# Patient Record
Sex: Female | Born: 1975 | Race: Black or African American | Hispanic: No | Smoking: Never smoker
Health system: Southern US, Community
[De-identification: ages and names within clinical notes are randomized; demographics above are authoritative.]

## PROBLEM LIST (undated history)

## (undated) DIAGNOSIS — G809 Cerebral palsy, unspecified: Secondary | ICD-10-CM

## (undated) DIAGNOSIS — M1990 Unspecified osteoarthritis, unspecified site: Secondary | ICD-10-CM

## (undated) HISTORY — PX: TRACHEOSTOMY: SUR1362

## (undated) HISTORY — PX: ANKLE FUSION: SHX881

## (undated) HISTORY — PX: LEG SURGERY: SHX1003

---

## 2011-05-28 ENCOUNTER — Emergency Department (HOSPITAL_COMMUNITY): Payer: Medicare Other

## 2011-05-28 ENCOUNTER — Emergency Department (HOSPITAL_COMMUNITY)
Admission: EM | Admit: 2011-05-28 | Discharge: 2011-05-28 | Disposition: A | Discharge status: Home / Self Care | Payer: Medicare Other | Attending: Emergency Medicine | Admitting: Emergency Medicine

## 2011-05-28 DIAGNOSIS — R05 Cough: Secondary | ICD-10-CM | POA: Insufficient documentation

## 2011-05-28 DIAGNOSIS — G809 Cerebral palsy, unspecified: Secondary | ICD-10-CM | POA: Insufficient documentation

## 2011-05-28 DIAGNOSIS — M129 Arthropathy, unspecified: Secondary | ICD-10-CM | POA: Insufficient documentation

## 2011-05-28 DIAGNOSIS — J3489 Other specified disorders of nose and nasal sinuses: Secondary | ICD-10-CM | POA: Insufficient documentation

## 2011-05-28 DIAGNOSIS — R059 Cough, unspecified: Secondary | ICD-10-CM | POA: Insufficient documentation

## 2011-05-28 DIAGNOSIS — R319 Hematuria, unspecified: Secondary | ICD-10-CM | POA: Insufficient documentation

## 2011-09-01 ENCOUNTER — Inpatient Hospital Stay (INDEPENDENT_AMBULATORY_CARE_PROVIDER_SITE_OTHER)
Admission: RE | Admit: 2011-09-01 | Discharge: 2011-09-01 | Disposition: A | Discharge status: Home / Self Care | Payer: Medicare Other | Source: Ambulatory Visit | Attending: Family Medicine | Admitting: Family Medicine

## 2011-09-01 DIAGNOSIS — S40029A Contusion of unspecified upper arm, initial encounter: Secondary | ICD-10-CM

## 2011-09-01 DIAGNOSIS — M542 Cervicalgia: Secondary | ICD-10-CM

## 2011-10-28 ENCOUNTER — Inpatient Hospital Stay (INDEPENDENT_AMBULATORY_CARE_PROVIDER_SITE_OTHER)
Admission: RE | Admit: 2011-10-28 | Discharge: 2011-10-28 | Disposition: A | Discharge status: Home / Self Care | Payer: Medicare Other | Source: Ambulatory Visit | Attending: Emergency Medicine | Admitting: Emergency Medicine

## 2011-10-28 DIAGNOSIS — N390 Urinary tract infection, site not specified: Secondary | ICD-10-CM

## 2011-10-28 LAB — POCT PREGNANCY, URINE: Preg Test, Ur: NEGATIVE

## 2011-10-28 LAB — POCT URINALYSIS DIP (DEVICE): Ketones, ur: NEGATIVE mg/dL

## 2012-02-04 ENCOUNTER — Encounter (HOSPITAL_COMMUNITY): Payer: Self-pay | Admitting: *Deleted

## 2012-02-04 ENCOUNTER — Emergency Department (INDEPENDENT_AMBULATORY_CARE_PROVIDER_SITE_OTHER)
Admission: EM | Admit: 2012-02-04 | Discharge: 2012-02-04 | Disposition: A | Discharge status: Home / Self Care | Payer: Medicare Other | Source: Home / Self Care

## 2012-02-04 DIAGNOSIS — K529 Noninfective gastroenteritis and colitis, unspecified: Secondary | ICD-10-CM

## 2012-02-04 DIAGNOSIS — K5289 Other specified noninfective gastroenteritis and colitis: Secondary | ICD-10-CM

## 2012-02-04 HISTORY — DX: Cerebral palsy, unspecified: G80.9

## 2012-02-04 MED ORDER — ONDANSETRON HCL 8 MG PO TABS: 8.0000 mg | ORAL_TABLET | Freq: Three times a day (TID) | ORAL | Status: AC | PRN

## 2012-02-04 MED ORDER — ONDANSETRON 4 MG PO TBDP
ORAL_TABLET | ORAL | Status: AC
  Filled 2012-02-04: qty 2

## 2012-02-04 MED ORDER — ONDANSETRON 4 MG PO TBDP
8.0000 mg | ORAL_TABLET | Freq: Once | ORAL | Status: AC
  Administered 2012-02-04: 8 mg via ORAL

## 2012-02-04 NOTE — ED Notes (Signed)
Pt  Has  Symptoms of  Vomiting  /  Diarrhea  With  Some  dizzyness         Started  This  Am  -   No  Pain     Ambulated  Well to  Room  Pt  Has  CP         She  Is  Awake  Alert  Pleasant      No  Active  vomiting

## 2012-02-04 NOTE — ED Provider Notes (Signed)
History     CSN: [id number]  Arrival date & time 02/04/12  1430   None     Chief Complaint  Patient presents with  . Emesis    (Consider location/radiation/quality/duration/timing/severity/associated sxs/prior treatment) HPI Comments: Patient presents with c/o onset of vomiting and diarrhea this morning. She has had 2 loose stools today, no blood. She has vomited approximately 20 times. She states she is hungry but afraid to try to eat anything. She is drinking small amounts of ginger ale. She is voiding without dysuria. She has epigastric abdominal pain only when vomiting. LMP was normal. No known sick contacts.    Past Medical History  Diagnosis Date  . CP (cerebral palsy)     History reviewed. No pertinent past surgical history.  History reviewed. No pertinent family history.  History  Substance Use Topics  . Smoking status: Not on file  . Smokeless tobacco: Not on file  . Alcohol Use:     OB History    Grav Para Term Preterm Abortions TAB SAB Ect Mult Living                  Review of Systems  Constitutional: Positive for fatigue. Negative for fever and chills.  Respiratory: Negative for cough and shortness of breath.   Cardiovascular: Negative for chest pain.  Gastrointestinal: Positive for nausea, vomiting and abdominal pain. Negative for diarrhea and constipation.  Genitourinary: Negative for dysuria and frequency.  Neurological: Negative for dizziness and headaches.    Allergies  Review of patient's allergies indicates no known allergies.  Home Medications   Current Outpatient Rx  Name Route Sig Dispense Refill  . IBUPROFEN 800 MG PO TABS Oral Take 800 mg by mouth every 8 (eight) hours as needed.    Marland Kitchen TRAMADOL HCL 50 MG PO TABS Oral Take 50 mg by mouth every 6 (six) hours as needed.    Marland Kitchen ONDANSETRON HCL 8 MG PO TABS Oral Take 1 tablet (8 mg total) by mouth every 8 (eight) hours as needed for nausea. 12 tablet 0    BP 109/71  Pulse 91  Temp(Src)  97.1 F (36.2 C) (Oral)  Resp 16  SpO2 97%  LMP 01/15/2012  Physical Exam  Nursing note and vitals reviewed. Constitutional: She appears well-developed and well-nourished. No distress.  HENT:  Head: Normocephalic and atraumatic.  Right Ear: Tympanic membrane, external ear and ear canal normal.  Left Ear: Tympanic membrane, external ear and ear canal normal.  Nose: Nose normal.  Mouth/Throat: Uvula is midline, oropharynx is clear and moist and mucous membranes are normal. No oropharyngeal exudate, posterior oropharyngeal edema or posterior oropharyngeal erythema.  Neck: Neck supple.  Cardiovascular: Normal rate, regular rhythm and normal heart sounds.   Pulmonary/Chest: Effort normal and breath sounds normal. No respiratory distress.  Abdominal: Soft. Normal appearance and bowel sounds are normal. She exhibits no mass. There is no hepatosplenomegaly. There is tenderness (mild) in the epigastric area. There is no rebound, no guarding and no CVA tenderness.  Lymphadenopathy:    She has no cervical adenopathy.  Neurological: She is alert.  Skin: Skin is warm and dry.  Psychiatric: She has a normal mood and affect.    ED Course  Procedures (including critical care time)  Labs Reviewed - No data to display No results found.   1. Gastroenteritis       MDM  Onset of V/D this am. Pt drinking ginger ale in the exam room. No s/s of dehydration.  Melody Comas, [place] 02/04/12 [phone]

## 2012-02-07 NOTE — ED Provider Notes (Signed)
Medical screening examination/treatment/procedure(s) were performed by non-physician practitioner and as supervising physician I was immediately available for consultation/collaboration.  Luiz Blare MD   Luiz Blare, MD 02/07/12 [phone]

## 2012-02-19 ENCOUNTER — Encounter (HOSPITAL_COMMUNITY): Payer: Self-pay | Admitting: Emergency Medicine

## 2012-02-19 ENCOUNTER — Emergency Department (INDEPENDENT_AMBULATORY_CARE_PROVIDER_SITE_OTHER)
Admission: EM | Admit: 2012-02-19 | Discharge: 2012-02-19 | Disposition: A | Discharge status: Home / Self Care | Payer: Medicaid Other | Source: Home / Self Care | Attending: Emergency Medicine | Admitting: Emergency Medicine

## 2012-02-19 DIAGNOSIS — S335XXA Sprain of ligaments of lumbar spine, initial encounter: Secondary | ICD-10-CM

## 2012-02-19 DIAGNOSIS — S39012A Strain of muscle, fascia and tendon of lower back, initial encounter: Secondary | ICD-10-CM

## 2012-02-19 MED ORDER — IBUPROFEN 800 MG PO TABS: 800.0000 mg | ORAL_TABLET | Freq: Three times a day (TID) | ORAL | Status: AC

## 2012-02-19 MED ORDER — METHOCARBAMOL 500 MG PO TABS: 500.0000 mg | ORAL_TABLET | Freq: Three times a day (TID) | ORAL | Status: AC

## 2012-02-19 MED ORDER — TRAMADOL HCL 50 MG PO TABS: 100.0000 mg | ORAL_TABLET | Freq: Three times a day (TID) | ORAL | Status: AC | PRN

## 2012-02-19 NOTE — Discharge Instructions (Signed)
Back Pain, Adult Low back pain is very common. About 1 in 5 people have back pain.The cause of low back pain is rarely dangerous. The pain often gets better over time.About half of people with a sudden onset of back pain feel better in just 2 weeks. About 8 in 10 people feel better by 6 weeks.  CAUSES Some common causes of back pain include:  Strain of the muscles or ligaments supporting the spine.   Wear and tear (degeneration) of the spinal discs.   Arthritis.   Direct injury to the back.  DIAGNOSIS Most of the time, the direct cause of low back pain is not known.However, back pain can be treated effectively even when the exact cause of the pain is unknown.Answering your caregiver's questions about your overall health and symptoms is one of the most accurate ways to make sure the cause of your pain is not dangerous. If your caregiver needs more information, he or she may order lab work or imaging tests (X-rays or MRIs).However, even if imaging tests show changes in your back, this usually does not require surgery. HOME CARE INSTRUCTIONS For many people, back pain returns.Since low back pain is rarely dangerous, it is often a condition that people can learn to manageon their own.   Remain active. It is stressful on the back to sit or stand in one place. Do not sit, drive, or stand in one place for more than 30 minutes at a time. Take short walks on level surfaces as soon as pain allows.Try to increase the length of time you walk each day.   Do not stay in bed.Resting more than 1 or 2 days can delay your recovery.   Do not avoid exercise or work.Your body is made to move.It is not dangerous to be active, even though your back may hurt.Your back will likely heal faster if you return to being active before your pain is gone.   Pay attention to your body when you bend and lift. Many people have less discomfortwhen lifting if they bend their knees, keep the load close to their  bodies,and avoid twisting. Often, the most comfortable positions are those that put less stress on your recovering back.   Find a comfortable position to sleep. Use a firm mattress and lie on your side with your knees slightly bent. If you lie on your back, put a pillow under your knees.   Only take over-the-counter or prescription medicines as directed by your caregiver. Over-the-counter medicines to reduce pain and inflammation are often the most helpful.Your caregiver may prescribe muscle relaxant drugs.These medicines help dull your pain so you can more quickly return to your normal activities and healthy exercise.   Put ice on the injured area.   Put ice in a plastic bag.   Place a towel between your skin and the bag.   Leave the ice on for 15 to 20 minutes, 3 to 4 times a day for the first 2 to 3 days. After that, ice and heat may be alternated to reduce pain and spasms.   Ask your caregiver about trying back exercises and gentle massage. This may be of some benefit.   Avoid feeling anxious or stressed.Stress increases muscle tension and can worsen back pain.It is important to recognize when you are anxious or stressed and learn ways to manage it.Exercise is a great option.  SEEK MEDICAL CARE IF:  You have pain that is not relieved with rest or medicine.   You have   pain that does not improve in 1 week.   You have new symptoms.   You are generally not feeling well.  SEEK IMMEDIATE MEDICAL CARE IF:   You have pain that radiates from your back into your legs.   You develop new bowel or bladder control problems.   You have unusual weakness or numbness in your arms or legs.   You develop nausea or vomiting.   You develop abdominal pain.   You feel faint.  Document Released: 12/13/2005 Document Revised: 08/25/2011 Document Reviewed: 05/03/2011 [employer] Patient Information 2012 [employer]. 

## 2012-02-19 NOTE — ED Notes (Signed)
Back pain for a week, worsened today.  No known injury.  Patient reports history of back pain.

## 2012-02-19 NOTE — ED Notes (Signed)
Denies numbness or tingling in legs

## 2012-02-19 NOTE — ED Provider Notes (Signed)
Chief Complaint  Patient presents with  . Back Pain    History of Present Illness:   Mrs. Palla is an unfortunate 36 year old female with cerebral palsy. Because of the way she walks she's had lower back pain almost all her life. Usually she controls this with ibuprofen, but recently she and her daughter moved into a new apartment and they haven't had any beds or mattresses to sleep on, so they've been sleeping on the floor. Because of this her back pain has been worse. She describes pain in her lower lumbar spine without radiation. No numbness, tingling, muscle weakness, bladder, or bowel problems.  Review of Systems:  Other than noted above, the patient denies any of the following symptoms: Systemic:  No fever, chills, fatigue, or weight loss. GI:  No abdominal pain, nausea, vomiting, diarrhea, constipation or blood in stool. GU:  No dysuria, frequency, urgency, or hematuria. No incontinence or difficulty urinating.  M-S:  No neck pain, joint pain, arthritis, or myalgias. Neuro:  No parethesias or muscular weakness. Skin:  No rash or itching.   PMFSH:  Past medical history, family history, social history, meds, and allergies were reviewed.  Physical Exam:   Vital signs:  BP 119/73  Pulse 90  Temp(Src) 98.3 F (36.8 C) (Oral)  Resp 20  SpO2 100%  LMP 02/15/2012 General:  Alert, oriented, in no distress. Abdomen:  Soft, non-tender.  No organomegaly or mass.  No pulsatile midline abdominal mass or bruit. Back:  There was pain to palpation in the paravertebral muscles of the mid to lower lumbar spine bilaterally. There is no midline pain to palpation and no pain to palpation of the sacroiliac joints. Her back has limited range of motion, about 30% of normal, with pain. Straight leg raising produces some muscle stretching and pain but no radiating type pain Neuro:  Normal muscle strength, sensations and DTRs. Skin:  Clear, warm and dry.  No rash.   Radiology:  No results  found.  Assessment:   Diagnoses that have been ruled out:  None  Diagnoses that are still under consideration:  None  Final diagnoses:  Lumbar strain    Plan:   1.  The following meds were prescribed:   New Prescriptions   IBUPROFEN (ADVIL,MOTRIN) 800 MG TABLET    Take 1 tablet (800 mg total) by mouth 3 (three) times daily.   METHOCARBAMOL (ROBAXIN) 500 MG TABLET    Take 1 tablet (500 mg total) by mouth 3 (three) times daily.   TRAMADOL (ULTRAM) 50 MG TABLET    Take 2 tablets (100 mg total) by mouth every 8 (eight) hours as needed for pain.   2.  The patient was instructed in symptomatic care and handouts were given. 3.  The patient was told to return if becoming worse in any way, if no better in 3 or 4 days, and given some red flag symptoms that would indicate earlier return.    Roque Lias, MD 02/19/12 [phone]

## 2012-02-19 NOTE — ED Notes (Signed)
Patient made aware of delays and had no needs at this time 

## 2012-02-21 MED ORDER — TIZANIDINE HCL 4 MG PO TABS: 4.0000 mg | ORAL_TABLET | Freq: Four times a day (QID) | ORAL | Status: AC | PRN

## 2012-05-06 ENCOUNTER — Emergency Department (INDEPENDENT_AMBULATORY_CARE_PROVIDER_SITE_OTHER)
Admission: EM | Admit: 2012-05-06 | Discharge: 2012-05-06 | Disposition: A | Discharge status: Home / Self Care | Payer: Medicare Other | Source: Home / Self Care | Attending: Emergency Medicine | Admitting: Emergency Medicine

## 2012-05-06 ENCOUNTER — Encounter (HOSPITAL_COMMUNITY): Payer: Self-pay

## 2012-05-06 DIAGNOSIS — N390 Urinary tract infection, site not specified: Secondary | ICD-10-CM

## 2012-05-06 DIAGNOSIS — B356 Tinea cruris: Secondary | ICD-10-CM

## 2012-05-06 LAB — POCT URINALYSIS DIP (DEVICE)
Bilirubin Urine: NEGATIVE
Ketones, ur: NEGATIVE mg/dL

## 2012-05-06 LAB — POCT PREGNANCY, URINE: Preg Test, Ur: NEGATIVE

## 2012-05-06 MED ORDER — CEPHALEXIN 500 MG PO CAPS: 500.0000 mg | ORAL_CAPSULE | Freq: Three times a day (TID) | ORAL | Status: AC

## 2012-05-06 MED ORDER — TERBINAFINE HCL 1 % EX CREA: TOPICAL_CREAM | Freq: Two times a day (BID) | CUTANEOUS | Status: AC

## 2012-05-06 NOTE — ED Notes (Signed)
Pt has urinary frequency and pain, was seen by her MD in [place] and given metronidozole and gabapentin last week.

## 2012-05-06 NOTE — Discharge Instructions (Signed)

## 2012-05-06 NOTE — ED Provider Notes (Signed)
Chief Complaint  Patient presents with  . Urinary Tract Infection    History of Present Illness:   The patient is a 36 year old female who has had a one-week history of dysuria and frequency. She denies any urgency or hematuria. She had a pelvic exam right before this began. She was given metronidazole, presumably for a bacterial vaginal infection. She was also given gabapentin, but she's not sure why she is taking this. She also has had a one to two-week history of a mildly pruritic rash in both groin areas. She denies any other GYN complaints. Denies fever, chills, nausea, vomiting, back pain, or pelvic pain. She has had urinary tract infections in the past. Her last menstrual period was April 29. She is sexually active without use of birth control.  Review of Systems:  Other than noted above, the patient denies any of the following symptoms: General:  No fevers, chills, sweats, aches, or fatigue. GI:  No abdominal pain, back pain, nausea, vomiting, diarrhea, or constipation. GU:  No dysuria, frequency, urgency, hematuria, or incontinence. GYN:  No discharge, itching, vulvar pain or lesions, pelvic pain, or abnormal vaginal bleeding.  PMFSH:  Past medical history, family history, social history, meds, and allergies were reviewed.  Physical Exam:   Vital signs:  BP 122/77  Pulse 88  Temp(Src) 97.6 F (36.4 C) (Oral)  Resp 18  SpO2 100% Gen:  Alert, oriented, in no distress. Lungs:  Clear to auscultation, no wheezes, rales or rhonchi. Heart:  Regular rhythm, no gallop or murmer. Abdomen:  Flat and soft. There was slight suprapubic pain to palpation.  No guarding, or rebound.  No hepato-splenomegaly or mass.  Bowel sounds were normally active.  No hernia. Back:  No CVA tenderness.  Skin:  She has a hyperpigmented, mildly scaly, confluent rash with well-demarcated borders of both groin areas.  Labs:   Results for orders placed during the hospital encounter of 05/06/12  POCT URINALYSIS  DIP (DEVICE)      Component Value Range   Glucose, UA NEGATIVE  NEGATIVE (mg/dL)   Bilirubin Urine NEGATIVE  NEGATIVE    Ketones, ur NEGATIVE  NEGATIVE (mg/dL)   Specific Gravity, Urine 1.010  1.005 - 1.030    Hgb urine dipstick TRACE (*) NEGATIVE    pH 7.0  5.0 - 8.0    Protein, ur NEGATIVE  NEGATIVE (mg/dL)   Urobilinogen, UA 0.2  0.0 - 1.0 (mg/dL)   Nitrite NEGATIVE  NEGATIVE    Leukocytes, UA SMALL (*) NEGATIVE   POCT PREGNANCY, URINE      Component Value Range   Preg Test, Ur NEGATIVE  NEGATIVE      Assessment: The primary encounter diagnosis was UTI (lower urinary tract infection). A diagnosis of Tinea cruris was also pertinent to this visit.   Plan:   1.  The following meds were prescribed:   New Prescriptions   CEPHALEXIN (KEFLEX) 500 MG CAPSULE    Take 1 capsule (500 mg total) by mouth 3 (three) times daily.   TERBINAFINE (LAMISIL) 1 % CREAM    Apply topically 2 (two) times daily.   2.  The patient was instructed in symptomatic care and handouts were given. 3.  The patient was told to return if becoming worse in any way, if no better in 3 or 4 days, and given some red flag symptoms that would indicate earlier return. 4.  The patient was told to avoid intercourse for 10 days, get extra fluids, and return for a follow up  with her primary care doctor at the completion of treatment for a repeat UA and culture.     Reuben Likes, MD 05/06/12 [phone]

## 2012-05-08 LAB — URINE CULTURE: Culture  Setup Time: 201305112056

## 2012-05-08 NOTE — ED Notes (Signed)
Urine culture: >100,000 colonies E. Coli.  Pt. adequately treated with Keflex. Vassie Moselle 05/08/2012

## 2012-05-11 ENCOUNTER — Ambulatory Visit: Payer: Medicare Other | Attending: Nurse Practitioner | Admitting: Physical Therapy

## 2012-05-11 DIAGNOSIS — IMO0001 Reserved for inherently not codable concepts without codable children: Secondary | ICD-10-CM | POA: Insufficient documentation

## 2012-05-11 DIAGNOSIS — M2550 Pain in unspecified joint: Secondary | ICD-10-CM | POA: Insufficient documentation

## 2012-05-16 ENCOUNTER — Other Ambulatory Visit: Payer: Self-pay | Admitting: *Deleted

## 2012-05-16 DIAGNOSIS — N6019 Diffuse cystic mastopathy of unspecified breast: Secondary | ICD-10-CM

## 2012-05-17 ENCOUNTER — Other Ambulatory Visit: Payer: Self-pay | Admitting: *Deleted

## 2012-05-17 DIAGNOSIS — N949 Unspecified condition associated with female genital organs and menstrual cycle: Secondary | ICD-10-CM

## 2012-05-17 DIAGNOSIS — N83209 Unspecified ovarian cyst, unspecified side: Secondary | ICD-10-CM

## 2012-05-18 ENCOUNTER — Ambulatory Visit
Admission: RE | Admit: 2012-05-18 | Discharge: 2012-05-18 | Disposition: A | Discharge status: Home / Self Care | Payer: Medicare Other | Source: Ambulatory Visit | Attending: *Deleted | Admitting: *Deleted

## 2012-05-18 DIAGNOSIS — N83209 Unspecified ovarian cyst, unspecified side: Secondary | ICD-10-CM

## 2012-05-18 DIAGNOSIS — N949 Unspecified condition associated with female genital organs and menstrual cycle: Secondary | ICD-10-CM

## 2012-05-19 ENCOUNTER — Ambulatory Visit: Payer: Medicare Other | Admitting: Physical Therapy

## 2012-05-26 ENCOUNTER — Ambulatory Visit: Payer: Medicare Other | Admitting: Physical Therapy

## 2012-05-29 ENCOUNTER — Ambulatory Visit
Admission: RE | Admit: 2012-05-29 | Discharge: 2012-05-29 | Disposition: A | Discharge status: Home / Self Care | Payer: Medicare Other | Source: Ambulatory Visit | Attending: *Deleted | Admitting: *Deleted

## 2012-05-29 DIAGNOSIS — N6019 Diffuse cystic mastopathy of unspecified breast: Secondary | ICD-10-CM

## 2012-06-02 ENCOUNTER — Ambulatory Visit: Payer: Medicare Other | Admitting: Physical Therapy

## 2012-06-06 ENCOUNTER — Ambulatory Visit: Payer: Medicare Other | Attending: Nurse Practitioner | Admitting: Physical Therapy

## 2012-06-06 DIAGNOSIS — M2550 Pain in unspecified joint: Secondary | ICD-10-CM | POA: Insufficient documentation

## 2012-06-06 DIAGNOSIS — IMO0001 Reserved for inherently not codable concepts without codable children: Secondary | ICD-10-CM | POA: Insufficient documentation

## 2012-06-09 ENCOUNTER — Ambulatory Visit: Payer: Medicare Other | Admitting: Physical Therapy

## 2012-07-06 DIAGNOSIS — R209 Unspecified disturbances of skin sensation: Secondary | ICD-10-CM | POA: Insufficient documentation

## 2012-07-06 DIAGNOSIS — G808 Other cerebral palsy: Secondary | ICD-10-CM | POA: Insufficient documentation

## 2012-07-06 DIAGNOSIS — R202 Paresthesia of skin: Secondary | ICD-10-CM | POA: Insufficient documentation

## 2012-07-31 ENCOUNTER — Ambulatory Visit: Payer: Medicare Other | Admitting: Physical Therapy

## 2012-07-31 ENCOUNTER — Ambulatory Visit: Payer: Medicare Other | Attending: Nurse Practitioner | Admitting: Physical Therapy

## 2012-07-31 DIAGNOSIS — IMO0001 Reserved for inherently not codable concepts without codable children: Secondary | ICD-10-CM | POA: Insufficient documentation

## 2012-07-31 DIAGNOSIS — M2550 Pain in unspecified joint: Secondary | ICD-10-CM | POA: Insufficient documentation

## 2012-10-11 ENCOUNTER — Emergency Department (INDEPENDENT_AMBULATORY_CARE_PROVIDER_SITE_OTHER)
Admission: EM | Admit: 2012-10-11 | Discharge: 2012-10-11 | Disposition: A | Discharge status: Home / Self Care | Payer: Medicare Other | Source: Home / Self Care | Attending: Family Medicine | Admitting: Family Medicine

## 2012-10-11 ENCOUNTER — Encounter (HOSPITAL_COMMUNITY): Payer: Self-pay | Admitting: *Deleted

## 2012-10-11 DIAGNOSIS — B3749 Other urogenital candidiasis: Secondary | ICD-10-CM

## 2012-10-11 DIAGNOSIS — K5289 Other specified noninfective gastroenteritis and colitis: Secondary | ICD-10-CM

## 2012-10-11 DIAGNOSIS — K529 Noninfective gastroenteritis and colitis, unspecified: Secondary | ICD-10-CM

## 2012-10-11 LAB — POCT URINALYSIS DIP (DEVICE)
Ketones, ur: NEGATIVE mg/dL
Protein, ur: NEGATIVE mg/dL
Specific Gravity, Urine: 1.015 (ref 1.005–1.030)
pH: 6.5 (ref 5.0–8.0)

## 2012-10-11 LAB — POCT PREGNANCY, URINE: Preg Test, Ur: NEGATIVE

## 2012-10-11 MED ORDER — ONDANSETRON HCL 4 MG PO TABS: 4.0000 mg | ORAL_TABLET | Freq: Four times a day (QID) | ORAL | Status: DC

## 2012-10-11 MED ORDER — ONDANSETRON 4 MG PO TBDP
4.0000 mg | ORAL_TABLET | Freq: Once | ORAL | Status: AC
  Administered 2012-10-11: 4 mg via ORAL

## 2012-10-11 MED ORDER — ONDANSETRON 4 MG PO TBDP
ORAL_TABLET | ORAL | Status: AC
  Filled 2012-10-11: qty 1

## 2012-10-11 MED ORDER — CLOTRIMAZOLE-BETAMETHASONE 1-0.05 % EX CREA: TOPICAL_CREAM | CUTANEOUS | Status: DC

## 2012-10-11 NOTE — ED Notes (Addendum)
Vomiting onset 0600 every time she tries to eat something.  V x 2.  Rash between her legs onset last week. No itching.  States her urine smells real bad.

## 2012-10-11 NOTE — ED Notes (Signed)
School note given as directed by Dr. Artis Flock.

## 2012-10-11 NOTE — ED Provider Notes (Signed)
History     CSN: [id number]  Arrival date & time 10/11/12  4696   First MD Initiated Contact with Patient 10/11/12 1819      Chief Complaint  Patient presents with  . Emesis    (Consider location/radiation/quality/duration/timing/severity/associated sxs/prior treatment) Patient is a 36 y.o. female presenting with vomiting. The history is provided by the patient.  Emesis  This is a new problem. The current episode started 12 to 24 hours ago. The problem occurs 2 to 4 times per day. The problem has been gradually improving. The emesis has an appearance of stomach contents. There has been no fever. Pertinent negatives include no chills, no diarrhea and no fever. Associated symptoms comments: Also with groin rash for 2 days and urine smells bad..    Past Medical History  Diagnosis Date  . CP (cerebral palsy)     Past Surgical History  Procedure Date  . Leg surgery   . Tracheostomy     History reviewed. No pertinent family history.  History  Substance Use Topics  . Smoking status: Never Smoker   . Smokeless tobacco: Not on file  . Alcohol Use: No    OB History    Grav Para Term Preterm Abortions TAB SAB Ect Mult Living                  Review of Systems  Constitutional: Negative for fever and chills.  Gastrointestinal: Positive for nausea and vomiting. Negative for diarrhea.  Genitourinary: Positive for dysuria. Negative for urgency, frequency and hematuria.    Allergies  Review of patient's allergies indicates no known allergies.  Home Medications   Current Outpatient Rx  Name Route Sig Dispense Refill  . GABAPENTIN 300 MG PO CAPS Oral Take 300 mg by mouth 3 (three) times daily.    . IBUPROFEN 800 MG PO TABS Oral Take 800 mg by mouth every 8 (eight) hours as needed.    Marland Kitchen TRAMADOL HCL 50 MG PO TABS Oral Take 50 mg by mouth every 6 (six) hours as needed.    Marland Kitchen CLOTRIMAZOLE-BETAMETHASONE 1-0.05 % EX CREA  Apply to affected area 2 times daily prn 45 g 0  .  METRONIDAZOLE 500 MG PO TABS Oral Take 500 mg by mouth 3 (three) times daily.    Marland Kitchen ONDANSETRON HCL 4 MG PO TABS Oral Take 1 tablet (4 mg total) by mouth every 6 (six) hours. As needed for n/v. 8 tablet 0  . TERBINAFINE HCL 1 % EX CREA Topical Apply topically 2 (two) times daily. 42 g 0    BP 111/67  Pulse 102  Temp 98 F (36.7 C) (Oral)  Resp 18  SpO2 97%  LMP 09/26/2012  Physical Exam  Nursing note and vitals reviewed. Constitutional: She is oriented to person, place, and time. She appears well-developed and well-nourished.  HENT:  Mouth/Throat: Oropharynx is clear and moist.  Eyes: Pupils are equal, round, and reactive to light.  Neck: Normal range of motion. Neck supple.  Pulmonary/Chest: Breath sounds normal.  Abdominal: Soft. Bowel sounds are normal. There is no tenderness.  Lymphadenopathy:    She has no cervical adenopathy.  Neurological: She is alert and oriented to person, place, and time.  Skin: Skin is warm and dry. Rash noted.       Dry well demarcated, sl erythematous inner thigh rash bilat.    ED Course  Procedures (including critical care time)   Labs Reviewed  POCT URINALYSIS DIP (DEVICE)  POCT PREGNANCY, URINE  No results found.   1. Gastroenteritis, acute   2. Candidiasis of perineum       MDM         Linna Hoff, MD 10/11/12 1920

## 2012-10-21 ENCOUNTER — Emergency Department (HOSPITAL_COMMUNITY)
Admission: EM | Admit: 2012-10-21 | Discharge: 2012-10-21 | Disposition: A | Discharge status: Home / Self Care | Payer: Medicare Other | Attending: Emergency Medicine | Admitting: Emergency Medicine

## 2012-10-21 ENCOUNTER — Emergency Department (HOSPITAL_COMMUNITY): Payer: Medicare Other

## 2012-10-21 ENCOUNTER — Encounter (HOSPITAL_COMMUNITY): Payer: Self-pay | Admitting: *Deleted

## 2012-10-21 DIAGNOSIS — Z79899 Other long term (current) drug therapy: Secondary | ICD-10-CM | POA: Insufficient documentation

## 2012-10-21 DIAGNOSIS — S139XXA Sprain of joints and ligaments of unspecified parts of neck, initial encounter: Secondary | ICD-10-CM | POA: Diagnosis not present

## 2012-10-21 DIAGNOSIS — G809 Cerebral palsy, unspecified: Secondary | ICD-10-CM | POA: Insufficient documentation

## 2012-10-21 DIAGNOSIS — Y9389 Activity, other specified: Secondary | ICD-10-CM | POA: Insufficient documentation

## 2012-10-21 DIAGNOSIS — S161XXA Strain of muscle, fascia and tendon at neck level, initial encounter: Secondary | ICD-10-CM

## 2012-10-21 MED ORDER — NAPROXEN 500 MG PO TABS: 500.0000 mg | ORAL_TABLET | Freq: Two times a day (BID) | ORAL | Status: DC
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
## 2013-03-06 ENCOUNTER — Other Ambulatory Visit (HOSPITAL_COMMUNITY)
Admission: RE | Admit: 2013-03-06 | Discharge: 2013-03-06 | Disposition: A | Discharge status: Home / Self Care | Payer: PRIVATE HEALTH INSURANCE | Source: Ambulatory Visit | Attending: Internal Medicine | Admitting: Internal Medicine

## 2013-03-06 ENCOUNTER — Encounter (HOSPITAL_COMMUNITY): Payer: Self-pay | Admitting: Emergency Medicine

## 2013-03-06 ENCOUNTER — Emergency Department (INDEPENDENT_AMBULATORY_CARE_PROVIDER_SITE_OTHER)
Admission: EM | Admit: 2013-03-06 | Discharge: 2013-03-06 | Disposition: A | Discharge status: Home / Self Care | Payer: Medicare Other | Source: Home / Self Care

## 2013-03-06 DIAGNOSIS — Z113 Encounter for screening for infections with a predominantly sexual mode of transmission: Secondary | ICD-10-CM | POA: Insufficient documentation

## 2013-03-06 DIAGNOSIS — N390 Urinary tract infection, site not specified: Secondary | ICD-10-CM

## 2013-03-06 DIAGNOSIS — N760 Acute vaginitis: Secondary | ICD-10-CM | POA: Insufficient documentation

## 2013-03-06 LAB — POCT PREGNANCY, URINE: Preg Test, Ur: NEGATIVE

## 2013-03-06 MED ORDER — CIPROFLOXACIN HCL 500 MG PO TABS: 500.0000 mg | ORAL_TABLET | Freq: Two times a day (BID) | ORAL | Status: DC

## 2013-03-06 MED ORDER — METRONIDAZOLE 500 MG PO TABS: 500.0000 mg | ORAL_TABLET | Freq: Three times a day (TID) | ORAL | Status: DC

## 2013-03-06 NOTE — ED Notes (Signed)
Abdominal pain that started this morning per information sheet

## 2013-03-06 NOTE — ED Notes (Signed)
Patient changing into gown 

## 2013-03-06 NOTE — Progress Notes (Addendum)
Patient Demographics  Amber Juarez, is a 37 y.o. female  WGN:[id number]  HQI:[id number]  DOB - 10-24-1976  No chief complaint on file.       Subjective:   Amber Juarez today is here with some urinary frequency and urgency, mild suprapubic ache, series yesterday had watery discharge from her vagina area which had a foul odor to it smells like fish, today the discharge is better. She has sex with one partner who she lives with, sex is unprotected all times. No fever chills, no other complaints.  Objective:    Filed Vitals:   03/06/13 1454  BP: 100/62  Pulse: 86  Temp: 97.3 F (36.3 C)  TempSrc: Oral  Resp: 16  SpO2: 100%     Exam  Awake Alert, Oriented X 3, No new F.N deficits, Normal affect [place].AT,PERRAL Supple Neck,No JVD, No cervical lymphadenopathy appriciated.  Symmetrical Chest wall movement, Good air movement bilaterally, CTAB RRR,No Gallops,Rubs or new Murmurs, No Parasternal Heave +ve B.Sounds, Abd Soft, Non tender, No organomegaly appriciated, No rebound - guarding or rigidity. No Cyanosis, Clubbing or edema, No new Rash or bruise No suprapubic tenderness, pelvic exam is unremarkable, no unusual for general or endocervical discharge, cervix appear normal, on bimanual exam no unusual palpable masses or cervical motion tenderness.    Data Review   CBC No results found for this basename: WBC, HGB, HCT, PLT, MCV, MCH, MCHC, RDW, NEUTRABS, LYMPHSABS, MONOABS, EOSABS, BASOSABS, BANDABS, BANDSABD,  in the last 168 hours  Chemistries   No results found for this basename: NA, K, CL, CO2, GLUCOSE, BUN, CREATININE, GFRCGP, CALCIUM, MG, AST, ALT, ALKPHOS, BILITOT,  in the last 168 hours ------------------------------------------------------------------------------------------------------------------ No results found for this basename: HGBA1C,  in the last 72  hours ------------------------------------------------------------------------------------------------------------------ No results found for this basename: CHOL, HDL, LDLCALC, TRIG, CHOLHDL, LDLDIRECT,  in the last 72 hours ------------------------------------------------------------------------------------------------------------------ No results found for this basename: TSH, T4TOTAL, FREET3, T3FREE, THYROIDAB,  in the last 72 hours ------------------------------------------------------------------------------------------------------------------ No results found for this basename: VITAMINB12, FOLATE, FERRITIN, TIBC, IRON, RETICCTPCT,  in the last 72 hours  Coagulation profile  No results found for this basename: INR, PROTIME,  in the last 168 hours     Prior to Admission medications   Medication Sig Start Date End Date Taking? Authorizing Provider  ciprofloxacin (CIPRO) 500 MG tablet Take 1 tablet (500 mg total) by mouth 2 (two) times daily. 03/06/13   Leroy Sea, MD  clotrimazole-betamethasone (LOTRISONE) cream Apply to affected area 2 times daily prn 10/11/12   Linna Hoff, MD  gabapentin (NEURONTIN) 300 MG capsule Take 300 mg by mouth 3 (three) times daily.    Historical Provider, MD  ibuprofen (ADVIL,MOTRIN) 800 MG tablet Take 800 mg by mouth every 8 (eight) hours as needed.    Historical Provider, MD  metroNIDAZOLE (FLAGYL) 500 MG tablet Take 1 tablet (500 mg total) by mouth 3 (three) times daily. 03/06/13   Leroy Sea, MD  naproxen (NAPROSYN) 500 MG tablet Take 1 tablet (500 mg total) by mouth 2 (two) times daily. 10/21/12   Celene Kras, MD  terbinafine (LAMISIL) 1 % cream Apply topically 2 (two) times daily. 05/06/12 05/06/13  Reuben Likes, MD  traMADol (ULTRAM) 50 MG tablet Take 50 mg by mouth every 6 (six) hours as needed.    Historical Provider, MD     Assessment & Plan   UTI, possibly resolving bacterial vaginosis - pregnancy test is negative, pelvic exam  unremarkable, cervicovaginal ancillary panel ordered,  patient will be placed on 3 days of Cipro along with Flagyl for 7 days. Chances of bacterial vaginosis clinically are pretty low. UA dipstick shows leukocyte Estrace positive, culture sent. Patient was offered HIV and syphilis testing which she declined at this time.  Patient advised that if she develops any worsening of her suprapubic ache, discharge gets worse, she should seek medical attention at that point.    Follow-up Information   Follow up with this clinic. Call in 4 days. (culture result follow up)        Leroy Sea M.D on 03/06/2013 at 2:58 PM

## 2013-03-08 LAB — POCT URINALYSIS DIP (DEVICE)
Glucose, UA: NEGATIVE mg/dL
Ketones, ur: NEGATIVE mg/dL
Protein, ur: NEGATIVE mg/dL
Specific Gravity, Urine: >1.03 — ABNORMAL HIGH (ref 1.005–1.030)
Urobilinogen, UA: 1 mg/dL (ref 0.0–1.0)

## 2013-06-15 ENCOUNTER — Encounter (HOSPITAL_COMMUNITY): Payer: Self-pay | Admitting: Emergency Medicine

## 2013-06-15 ENCOUNTER — Emergency Department (HOSPITAL_COMMUNITY): Payer: PRIVATE HEALTH INSURANCE

## 2013-06-15 ENCOUNTER — Emergency Department (HOSPITAL_COMMUNITY)
Admission: EM | Admit: 2013-06-15 | Discharge: 2013-06-15 | Disposition: A | Discharge status: Home / Self Care | Payer: PRIVATE HEALTH INSURANCE | Attending: Emergency Medicine | Admitting: Emergency Medicine

## 2013-06-15 DIAGNOSIS — Z8739 Personal history of other diseases of the musculoskeletal system and connective tissue: Secondary | ICD-10-CM | POA: Insufficient documentation

## 2013-06-15 DIAGNOSIS — K5900 Constipation, unspecified: Secondary | ICD-10-CM | POA: Insufficient documentation

## 2013-06-15 DIAGNOSIS — K219 Gastro-esophageal reflux disease without esophagitis: Secondary | ICD-10-CM | POA: Insufficient documentation

## 2013-06-15 DIAGNOSIS — R109 Unspecified abdominal pain: Secondary | ICD-10-CM | POA: Insufficient documentation

## 2013-06-15 DIAGNOSIS — Z3202 Encounter for pregnancy test, result negative: Secondary | ICD-10-CM | POA: Insufficient documentation

## 2013-06-15 DIAGNOSIS — Z8669 Personal history of other diseases of the nervous system and sense organs: Secondary | ICD-10-CM | POA: Insufficient documentation

## 2013-06-15 DIAGNOSIS — R110 Nausea: Secondary | ICD-10-CM | POA: Insufficient documentation

## 2013-06-15 HISTORY — DX: Unspecified osteoarthritis, unspecified site: M19.90

## 2013-06-15 LAB — COMPREHENSIVE METABOLIC PANEL
ALT: 15 U/L (ref 0–35)
AST: 18 U/L (ref 0–37)
Albumin: 3.7 g/dL (ref 3.5–5.2)
Alkaline Phosphatase: 82 U/L (ref 39–117)
CO2: 23 mEq/L (ref 19–32)
Chloride: 104 mEq/L (ref 96–112)
Potassium: 3.8 mEq/L (ref 3.5–5.1)
Total Bilirubin: 0.2 mg/dL — ABNORMAL LOW (ref 0.3–1.2)

## 2013-06-15 LAB — CBC WITH DIFFERENTIAL/PLATELET
Basophils Absolute: 0 10*3/uL (ref 0.0–0.1)
Basophils Relative: 0 % (ref 0–1)
Hemoglobin: 12.5 g/dL (ref 12.0–15.0)
Lymphocytes Relative: 26 % (ref 12–46)
MCHC: 34.2 g/dL (ref 30.0–36.0)
Neutro Abs: 4.6 10*3/uL (ref 1.7–7.7)
Neutrophils Relative %: 59 % (ref 43–77)
RDW: 14.1 % (ref 11.5–15.5)
WBC: 7.8 10*3/uL (ref 4.0–10.5)

## 2013-06-15 LAB — POCT PREGNANCY, URINE: Preg Test, Ur: NEGATIVE

## 2013-06-15 MED ORDER — SUCRALFATE 1 G PO TABS: 1.0000 g | ORAL_TABLET | Freq: Four times a day (QID) | ORAL | Status: DC

## 2013-06-15 MED ORDER — DOCUSATE SODIUM 100 MG PO CAPS: 100.0000 mg | ORAL_CAPSULE | Freq: Two times a day (BID) | ORAL | Status: DC

## 2013-06-15 MED ORDER — SODIUM CHLORIDE 0.9 % IV SOLN: INTRAVENOUS | Status: DC

## 2013-06-15 MED ORDER — PANTOPRAZOLE SODIUM 20 MG PO TBEC: 20.0000 mg | DELAYED_RELEASE_TABLET | Freq: Every day | ORAL | Status: DC

## 2013-06-15 NOTE — ED Provider Notes (Signed)
History     CSN: [id number]  Arrival date & time 06/15/13  1432   First MD Initiated Contact with Patient 06/15/13 1552      Chief Complaint  Patient presents with  . Constipation    (Consider location/radiation/quality/duration/timing/severity/associated sxs/prior treatment) Patient is a 37 y.o. female presenting with constipation. The history is provided by the patient.  Constipation Severity:  Mild Time since last bowel movement:  3 days Timing:  Constant Progression:  Worsening Chronicity:  Recurrent Stool description:  Hard Relieved by:  Nothing Worsened by:  Nothing tried Ineffective treatments:  None tried Associated symptoms: abdominal pain and nausea   Associated symptoms: no diarrhea, no dysuria, no fever and no vomiting    pt here with ruq abd pain worse with eating, also notes constipation x 4 days without fever, vomiting--no tx used pta and ems called and pt transported here  Past Medical History  Diagnosis Date  . CP (cerebral palsy)   . Arthritis     Past Surgical History  Procedure Laterality Date  . Leg surgery    . Tracheostomy      History reviewed. No pertinent family history.  History  Substance Use Topics  . Smoking status: Never Smoker   . Smokeless tobacco: Not on file  . Alcohol Use: No    OB History   Grav Para Term Preterm Abortions TAB SAB Ect Mult Living                  Review of Systems  Constitutional: Negative for fever.  Gastrointestinal: Positive for nausea, abdominal pain and constipation. Negative for vomiting and diarrhea.  Genitourinary: Negative for dysuria.  All other systems reviewed and are negative.    Allergies  Shellfish allergy  Home Medications   Current Outpatient Rx  Name  Route  Sig  Dispense  Refill  . ibuprofen (ADVIL,MOTRIN) 800 MG tablet   Oral   Take 800 mg by mouth every 8 (eight) hours as needed for pain.          . traMADol (ULTRAM) 50 MG tablet   Oral   Take 50 mg by mouth every  6 (six) hours as needed for pain.            BP 133/83  Pulse 92  Temp(Src) 98.4 F (36.9 C) (Oral)  Resp 16  SpO2 99%  Physical Exam  Nursing note and vitals reviewed. Constitutional: She is oriented to person, place, and time. She appears well-developed and well-nourished.  Non-toxic appearance. No distress.  HENT:  Head: Normocephalic and atraumatic.  Eyes: Conjunctivae, EOM and lids are normal. Pupils are equal, round, and reactive to light.  Neck: Normal range of motion. Neck supple. No tracheal deviation present. No mass present.  Cardiovascular: Normal rate, regular rhythm and normal heart sounds.  Exam reveals no gallop.   No murmur heard. Pulmonary/Chest: Effort normal and breath sounds normal. No stridor. No respiratory distress. She has no decreased breath sounds. She has no wheezes. She has no rhonchi. She has no rales.  Abdominal: Soft. Normal appearance and bowel sounds are normal. She exhibits no distension. There is tenderness in the right upper quadrant and epigastric area. There is no rigidity, no rebound, no guarding and no CVA tenderness.  Musculoskeletal: Normal range of motion. She exhibits no edema and no tenderness.  Neurological: She is alert and oriented to person, place, and time. She has normal strength. No cranial nerve deficit or sensory deficit. GCS eye subscore is 4.  GCS verbal subscore is 5. GCS motor subscore is 6.  Skin: Skin is warm and dry. No abrasion and no rash noted.  Psychiatric: She has a normal mood and affect. Her speech is normal and behavior is normal.    ED Course  Procedures (including critical care time)  Labs Reviewed  CBC WITH DIFFERENTIAL  COMPREHENSIVE METABOLIC PANEL  LIPASE, BLOOD  POCT PREGNANCY, URINE   No results found.   No diagnosis found.    MDM  pts acute abd series reviewed and mild constipation noted, no excessive stool in rectal vault--sx are worse with eating and suspect gerd as abd u/s was  negative--will place on ppi and carafate and duclax        Toy Baker, MD 06/15/13 1900

## 2013-06-15 NOTE — ED Notes (Signed)
Pt states she doesn't want to be stuck anymore. Tresa Endo, RN made aware.

## 2013-06-15 NOTE — ED Notes (Signed)
The patient is AOx4 and comfortable with the discharge instructions. 

## 2013-06-15 NOTE — ED Notes (Signed)
Per EMS: pt c/o abd pain after eating; pt sts no BM x 4 days

## 2013-06-15 NOTE — ED Notes (Signed)
Patient transported to Ultrasound 

## 2013-07-03 ENCOUNTER — Other Ambulatory Visit: Payer: Self-pay | Admitting: Nurse Practitioner

## 2013-07-03 DIAGNOSIS — N6310 Unspecified lump in the right breast, unspecified quadrant: Secondary | ICD-10-CM

## 2013-07-03 DIAGNOSIS — N6001 Solitary cyst of right breast: Secondary | ICD-10-CM

## 2013-07-13 ENCOUNTER — Other Ambulatory Visit: Payer: PRIVATE HEALTH INSURANCE

## 2013-07-25 ENCOUNTER — Other Ambulatory Visit: Payer: PRIVATE HEALTH INSURANCE

## 2013-07-27 DIAGNOSIS — N94819 Vulvodynia, unspecified: Secondary | ICD-10-CM | POA: Insufficient documentation

## 2013-07-27 DIAGNOSIS — M2140 Flat foot [pes planus] (acquired), unspecified foot: Secondary | ICD-10-CM | POA: Insufficient documentation

## 2013-07-27 DIAGNOSIS — M19079 Primary osteoarthritis, unspecified ankle and foot: Secondary | ICD-10-CM | POA: Insufficient documentation

## 2013-07-27 DIAGNOSIS — L988 Other specified disorders of the skin and subcutaneous tissue: Secondary | ICD-10-CM | POA: Insufficient documentation

## 2013-07-27 DIAGNOSIS — N94818 Other vulvodynia: Secondary | ICD-10-CM | POA: Insufficient documentation

## 2013-07-27 DIAGNOSIS — L989 Disorder of the skin and subcutaneous tissue, unspecified: Secondary | ICD-10-CM | POA: Insufficient documentation

## 2013-07-27 DIAGNOSIS — L723 Sebaceous cyst: Secondary | ICD-10-CM | POA: Insufficient documentation

## 2013-07-27 DIAGNOSIS — M2010 Hallux valgus (acquired), unspecified foot: Secondary | ICD-10-CM | POA: Insufficient documentation

## 2013-10-01 DIAGNOSIS — M21969 Unspecified acquired deformity of unspecified lower leg: Secondary | ICD-10-CM | POA: Insufficient documentation

## 2013-10-01 DIAGNOSIS — M216X9 Other acquired deformities of unspecified foot: Secondary | ICD-10-CM | POA: Insufficient documentation

## 2014-02-25 ENCOUNTER — Encounter (HOSPITAL_COMMUNITY): Payer: Self-pay | Admitting: Emergency Medicine

## 2014-02-25 ENCOUNTER — Emergency Department (INDEPENDENT_AMBULATORY_CARE_PROVIDER_SITE_OTHER)
Admission: EM | Admit: 2014-02-25 | Discharge: 2014-02-25 | Disposition: A | Discharge status: Home / Self Care | Payer: PRIVATE HEALTH INSURANCE | Source: Home / Self Care | Attending: Emergency Medicine | Admitting: Emergency Medicine

## 2014-02-25 DIAGNOSIS — N3000 Acute cystitis without hematuria: Secondary | ICD-10-CM

## 2014-02-25 LAB — POCT URINALYSIS DIP (DEVICE)
BILIRUBIN URINE: NEGATIVE
Glucose, UA: NEGATIVE mg/dL
KETONES UR: NEGATIVE mg/dL
Nitrite: NEGATIVE
PH: 6 (ref 5.0–8.0)
PROTEIN: NEGATIVE mg/dL
Specific Gravity, Urine: 1.025 (ref 1.005–1.030)
Urobilinogen, UA: 1 mg/dL (ref 0.0–1.0)

## 2014-02-25 LAB — POCT PREGNANCY, URINE: Preg Test, Ur: NEGATIVE

## 2014-02-25 MED ORDER — CEPHALEXIN 250 MG/5ML PO SUSR: 500.0000 mg | Freq: Three times a day (TID) | ORAL | Status: DC

## 2014-02-25 NOTE — ED Provider Notes (Signed)
Chief Complaint   Chief Complaint  Patient presents with  . Urinary Tract Infection    History of Present Illness   Amber BailiffLakeisha Juarez is a 38 year old female with cerebral palsy who has a history since 3 AM this morning of dysuria, frequency, urgency, and difficulty urinating. She denies any fever, chills, nausea, vomiting, lower back, lower abdominal pain. No GYN complaints. She's had a urinary tract infection before, but it's been a long time.  Review of Systems   Other than as noted above, the patient denies any of the following symptoms: General:  No fevers, chills, or sweats. GI:  No abdominal pain, back pain, nausea, vomiting, diarrhea, or constipation. GU:  No dysuria, frequency, urgency, hematuria, or incontinence. GYN:  No discharge, itching, vulvar pain or lesions, pelvic pain, or abnormal vaginal bleeding.  PMFSH   Past medical history, family history, social history, meds, and allergies were reviewed.    Physical Examination     Vital signs:  BP 134/83  Pulse 84  Temp(Src) 98.3 F (36.8 C) (Oral)  Resp 18  SpO2 100%  LMP 02/20/2014 Gen:  Alert, oriented, in no distress. Lungs:  Clear to auscultation, no wheezes, rales or rhonchi. Heart:  Regular rhythm, no gallop or murmer. Abdomen:  Flat and soft. There was slight suprapubic pain to palpation.  No guarding, or rebound.  No hepato-splenomegaly or mass.  Bowel sounds were normally active.  No hernia. Back:  No CVA tenderness.  Skin:  Clear, warm and dry.  Labs   Results for orders placed during the hospital encounter of 02/25/14  POCT URINALYSIS DIP (DEVICE)      Result Value Ref Range   Glucose, UA NEGATIVE  NEGATIVE mg/dL   Bilirubin Urine NEGATIVE  NEGATIVE   Ketones, ur NEGATIVE  NEGATIVE mg/dL   Specific Gravity, Urine 1.025  1.005 - 1.030   Hgb urine dipstick MODERATE (*) NEGATIVE   pH 6.0  5.0 - 8.0   Protein, ur NEGATIVE  NEGATIVE mg/dL   Urobilinogen, UA 1.0  0.0 - 1.0 mg/dL   Nitrite NEGATIVE   NEGATIVE   Leukocytes, UA SMALL (*) NEGATIVE  POCT PREGNANCY, URINE      Result Value Ref Range   Preg Test, Ur NEGATIVE  NEGATIVE     A urine culture was obtained.  Results are pending at this time and we will call about any positive results.  Assessment   The encounter diagnosis was Acute cystitis.   No evidence of pyelonephritis.    Plan   1.  Meds:  The following meds were prescribed:   Discharge Medication List as of 02/25/2014 12:29 PM    START taking these medications   Details  cephALEXin (KEFLEX) 250 MG/5ML suspension Take 10 mLs (500 mg total) by mouth 3 (three) times daily., Starting 02/25/2014, Until Discontinued, Normal        2.  Patient Education/Counseling:  The patient was given appropriate handouts, self care instructions, and instructed in symptomatic relief. The patient was told to avoid intercourse for 10 days, get extra fluids, and return for a follow up with her primary care doctor at the completion of treatment for a repeat UA and culture.    3.  Follow up:  The patient was told to follow up here if no better in 3 to 4 days, or sooner if becoming worse in any way, and given some red flag symptoms such as fever, persistent vomiting, or severe flank or abdominal pain which would prompt immediate return.  Reuben Likes, MD 02/25/14 2223

## 2014-02-25 NOTE — Discharge Instructions (Signed)
Urinary Tract Infection  Urinary tract infections (UTIs) can develop anywhere along your urinary tract. Your urinary tract is your body's drainage system for removing wastes and extra water. Your urinary tract includes two kidneys, two ureters, a bladder, and a urethra. Your kidneys are a pair of bean-shaped organs. Each kidney is about the size of your fist. They are located below your ribs, one on each side of your spine.  CAUSES  Infections are caused by microbes, which are microscopic organisms, including fungi, viruses, and bacteria. These organisms are so small that they can only be seen through a microscope. Bacteria are the microbes that most commonly cause UTIs.  SYMPTOMS   Symptoms of UTIs may vary by age and gender of the patient and by the location of the infection. Symptoms in young women typically include a frequent and intense urge to urinate and a painful, burning feeling in the bladder or urethra during urination. Older women and men are more likely to be tired, shaky, and weak and have muscle aches and abdominal pain. A fever may mean the infection is in your kidneys. Other symptoms of a kidney infection include pain in your back or sides below the ribs, nausea, and vomiting.  DIAGNOSIS  To diagnose a UTI, your caregiver will ask you about your symptoms. Your caregiver also will ask to provide a urine sample. The urine sample will be tested for bacteria and white blood cells. White blood cells are made by your body to help fight infection.  TREATMENT   Typically, UTIs can be treated with medication. Because most UTIs are caused by a bacterial infection, they usually can be treated with the use of antibiotics. The choice of antibiotic and length of treatment depend on your symptoms and the type of bacteria causing your infection.  HOME CARE INSTRUCTIONS   If you were prescribed antibiotics, take them exactly as your caregiver instructs you. Finish the medication even if you feel better after you  have only taken some of the medication.   Drink enough water and fluids to keep your urine clear or pale yellow.   Avoid caffeine, tea, and carbonated beverages. They tend to irritate your bladder.   Empty your bladder often. Avoid holding urine for long periods of time.   Empty your bladder before and after sexual intercourse.   After a bowel movement, women should cleanse from front to back. Use each tissue only once.  SEEK MEDICAL CARE IF:    You have back pain.   You develop a fever.   Your symptoms do not begin to resolve within 3 days.  SEEK IMMEDIATE MEDICAL CARE IF:    You have severe back pain or lower abdominal pain.   You develop chills.   You have nausea or vomiting.   You have continued burning or discomfort with urination.  MAKE SURE YOU:    Understand these instructions.   Will watch your condition.   Will get help right away if you are not doing well or get worse.  Document Released: 09/22/2005 Document Revised: 06/13/2012 Document Reviewed: 01/21/2012  [employer] Patient Information 2014 [employer]

## 2014-02-25 NOTE — ED Notes (Signed)
C/o urinary incontinence.   On set yesterday.  Denies lower back pain and any other symptoms.

## 2014-02-27 LAB — URINE CULTURE: SPECIAL REQUESTS: NORMAL

## 2014-02-27 NOTE — ED Notes (Signed)
Urine culture: >100,000 colonies Proteus Mirabilis.  Pt. adequately treated with Keflex. Amber Juarez, Amber Juarez 02/27/2014

## 2014-02-28 NOTE — Progress Notes (Signed)
Quick Note:  Results are abnormal as noted, but have been adequately treated. No further action necessary. ______ 

## 2014-03-21 ENCOUNTER — Other Ambulatory Visit: Payer: Self-pay | Admitting: Nurse Practitioner

## 2014-03-21 DIAGNOSIS — N6001 Solitary cyst of right breast: Secondary | ICD-10-CM

## 2014-03-21 DIAGNOSIS — N6009 Solitary cyst of unspecified breast: Secondary | ICD-10-CM

## 2014-04-09 ENCOUNTER — Other Ambulatory Visit: Payer: PRIVATE HEALTH INSURANCE

## 2014-04-09 ENCOUNTER — Emergency Department (INDEPENDENT_AMBULATORY_CARE_PROVIDER_SITE_OTHER)
Admission: EM | Admit: 2014-04-09 | Discharge: 2014-04-09 | Disposition: A | Discharge status: Home / Self Care | Payer: PRIVATE HEALTH INSURANCE | Source: Home / Self Care | Attending: Family Medicine | Admitting: Family Medicine

## 2014-04-09 ENCOUNTER — Encounter (HOSPITAL_COMMUNITY): Payer: Self-pay | Admitting: Emergency Medicine

## 2014-04-09 DIAGNOSIS — Y92410 Unspecified street and highway as the place of occurrence of the external cause: Secondary | ICD-10-CM

## 2014-04-09 DIAGNOSIS — M549 Dorsalgia, unspecified: Secondary | ICD-10-CM

## 2014-04-09 MED ORDER — HYDROCODONE-ACETAMINOPHEN 5-325 MG PO TABS: 1.0000 | ORAL_TABLET | Freq: Four times a day (QID) | ORAL | Status: DC | PRN
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
## 2014-04-12 NOTE — ED Provider Notes (Signed)
Medical screening examination/treatment/procedure(s) were performed by resident physician or non-physician practitioner and as supervising physician I was immediately available for consultation/collaboration.   Reeshemah Nazaryan DOUGLAS MD.   Deven Furia D Starletta Houchin, MD 04/12/14 1222 

## 2014-05-29 ENCOUNTER — Ambulatory Visit
Admission: RE | Admit: 2014-05-29 | Discharge: 2014-05-29 | Disposition: A | Discharge status: Home / Self Care | Payer: PRIVATE HEALTH INSURANCE | Source: Ambulatory Visit | Attending: Nurse Practitioner | Admitting: Nurse Practitioner

## 2014-05-29 ENCOUNTER — Encounter (INDEPENDENT_AMBULATORY_CARE_PROVIDER_SITE_OTHER): Payer: Self-pay

## 2014-05-29 DIAGNOSIS — N6001 Solitary cyst of right breast: Secondary | ICD-10-CM

## 2014-06-10 DIAGNOSIS — M2141 Flat foot [pes planus] (acquired), right foot: Secondary | ICD-10-CM | POA: Insufficient documentation

## 2014-07-19 ENCOUNTER — Emergency Department (INDEPENDENT_AMBULATORY_CARE_PROVIDER_SITE_OTHER)
Admission: EM | Admit: 2014-07-19 | Discharge: 2014-07-19 | Disposition: A | Discharge status: Home / Self Care | Payer: PRIVATE HEALTH INSURANCE | Source: Home / Self Care

## 2014-07-19 ENCOUNTER — Encounter (HOSPITAL_COMMUNITY): Payer: Self-pay | Admitting: Emergency Medicine

## 2014-07-19 DIAGNOSIS — N898 Other specified noninflammatory disorders of vagina: Secondary | ICD-10-CM

## 2014-07-19 DIAGNOSIS — Z23 Encounter for immunization: Secondary | ICD-10-CM

## 2014-07-19 DIAGNOSIS — N390 Urinary tract infection, site not specified: Secondary | ICD-10-CM

## 2014-07-19 DIAGNOSIS — IMO0002 Reserved for concepts with insufficient information to code with codable children: Secondary | ICD-10-CM

## 2014-07-19 DIAGNOSIS — W19XXXA Unspecified fall, initial encounter: Secondary | ICD-10-CM

## 2014-07-19 DIAGNOSIS — S80211A Abrasion, right knee, initial encounter: Secondary | ICD-10-CM

## 2014-07-19 LAB — POCT URINALYSIS DIP (DEVICE)
Bilirubin Urine: NEGATIVE
Glucose, UA: NEGATIVE mg/dL
Ketones, ur: NEGATIVE mg/dL
NITRITE: NEGATIVE
PH: 6.5 (ref 5.0–8.0)
PROTEIN: NEGATIVE mg/dL
Specific Gravity, Urine: >=1.03 (ref 1.005–1.030)
UROBILINOGEN UA: 2 mg/dL — AB (ref 0.0–1.0)

## 2014-07-19 LAB — POCT PREGNANCY, URINE: PREG TEST UR: NEGATIVE

## 2014-07-19 MED ORDER — TETANUS-DIPHTH-ACELL PERTUSSIS 5-2.5-18.5 LF-MCG/0.5 IM SUSP
0.5000 mL | Freq: Once | INTRAMUSCULAR | Status: AC
  Administered 2014-07-19: 0.5 mL via INTRAMUSCULAR

## 2014-07-19 MED ORDER — METRONIDAZOLE 500 MG PO TABS: 500.0000 mg | ORAL_TABLET | Freq: Two times a day (BID) | ORAL | Status: DC

## 2014-07-19 MED ORDER — TETANUS-DIPHTH-ACELL PERTUSSIS 5-2.5-18.5 LF-MCG/0.5 IM SUSP
INTRAMUSCULAR | Status: AC
  Filled 2014-07-19: qty 0.5

## 2014-07-19 MED ORDER — CEPHALEXIN 500 MG PO CAPS: 500.0000 mg | ORAL_CAPSULE | Freq: Four times a day (QID) | ORAL | Status: DC
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
## 2014-07-19 NOTE — Discharge Instructions (Signed)
Abrasion An abrasion is a cut or scrape of the skin. Abrasions do not extend through all layers of the skin and most heal within 10 days. It is important to care for your abrasion properly to prevent infection. CAUSES  Most abrasions are caused by falling on, or gliding across, the ground or other surface. When your skin rubs on something, the outer and inner layer of skin rubs off, causing an abrasion. DIAGNOSIS  Your caregiver will be able to diagnose an abrasion during a physical exam.  TREATMENT  Your treatment depends on how large and deep the abrasion is. Generally, your abrasion will be cleaned with water and a mild soap to remove any dirt or debris. An antibiotic ointment may be put over the abrasion to prevent an infection. A bandage (dressing) may be wrapped around the abrasion to keep it from getting dirty.  You may need a tetanus shot if:  You cannot remember when you had your last tetanus shot.  You have never had a tetanus shot.  The injury broke your skin. If you get a tetanus shot, your arm may swell, get red, and feel warm to the touch. This is common and not a problem. If you need a tetanus shot and you choose not to have one, there is a rare chance of getting tetanus. Sickness from tetanus can be serious.  HOME CARE INSTRUCTIONS   If a dressing was applied, change it at least once a day or as directed by your caregiver. If the bandage sticks, soak it off with warm water.   Wash the area with water and a mild soap to remove all the ointment 2 times a day. Rinse off the soap and pat the area dry with a clean towel.   Reapply any ointment as directed by your caregiver. This will help prevent infection and keep the bandage from sticking. Use gauze over the wound and under the dressing to help keep the bandage from sticking.   Change your dressing right away if it becomes wet or dirty.   Only take over-the-counter or prescription medicines for pain, discomfort, or fever as  directed by your caregiver.   Follow up with your caregiver within 24-48 hours for a wound check, or as directed. If you were not given a wound-check appointment, look closely at your abrasion for redness, swelling, or pus. These are signs of infection. SEEK IMMEDIATE MEDICAL CARE IF:   You have increasing pain in the wound.   You have redness, swelling, or tenderness around the wound.   You have pus coming from the wound.   You have a fever or persistent symptoms for more than 2-3 days.  You have a fever and your symptoms suddenly get worse.  You have a bad smell coming from the wound or dressing.  MAKE SURE YOU:   Understand these instructions.  Will watch your condition.  Will get help right away if you are not doing well or get worse. Document Released: 09/22/2005 Document Revised: 11/29/2012 Document Reviewed: 11/16/2011 [facility] Patient Information 2015 [place], [place]. This information is not intended to replace advice given to you by your health care provider. Make sure you discuss any questions you have with your health care provider.  Urinary Tract Infection A urinary tract infection (UTI) can occur any place along the urinary tract. The tract includes the kidneys, ureters, bladder, and urethra. A type of germ called bacteria often causes a UTI. UTIs are often helped with antibiotic medicine.  HOME CARE  If given, take antibiotics as told by your doctor. Finish them even if you start to feel better.  Drink enough fluids to keep your pee (urine) clear or pale yellow.  Avoid tea, drinks with caffeine, and bubbly (carbonated) drinks.  Pee often. Avoid holding your pee in for a long time.  Pee before and after having sex (intercourse).  Wipe from front to back after you poop (bowel movement) if you are a woman. Use each tissue only once. GET HELP RIGHT AWAY IF:   You have back pain.  You have lower belly (abdominal) pain.  You have chills.  You feel sick  to your stomach (nauseous).  You throw up (vomit).  Your burning or discomfort with peeing does not go away.  You have a fever.  Your symptoms are not better in 3 days. MAKE SURE YOU:   Understand these instructions.  Will watch your condition.  Will get help right away if you are not doing well or get worse. Document Released: 05/31/2008 Document Revised: 09/06/2012 Document Reviewed: 07/13/2012 [facility] Patient Information 2015 [place], [place]. This information is not intended to replace advice given to you by your health care provider. Make sure you discuss any questions you have with your health care provider.

## 2014-07-20 NOTE — ED Provider Notes (Signed)
Medical screening examination/treatment/procedure(s) were performed by resident physician or non-physician practitioner and as supervising physician I was immediately available for consultation/collaboration.   Obera Stauch DOUGLAS MD.   Shatia Sindoni D Carnell Beavers, MD 07/20/14 1104 

## 2014-07-21 LAB — URINE CULTURE
Colony Count: >=100000
Special Requests: NORMAL

## 2014-07-30 DIAGNOSIS — M25572 Pain in left ankle and joints of left foot: Secondary | ICD-10-CM

## 2014-07-30 DIAGNOSIS — M25571 Pain in right ankle and joints of right foot: Secondary | ICD-10-CM | POA: Insufficient documentation

## 2015-04-29 ENCOUNTER — Other Ambulatory Visit: Payer: Self-pay

## 2015-04-29 DIAGNOSIS — Z1231 Encounter for screening mammogram for malignant neoplasm of breast: Secondary | ICD-10-CM

## 2015-05-19 ENCOUNTER — Encounter (HOSPITAL_COMMUNITY): Payer: Self-pay | Admitting: Emergency Medicine

## 2015-05-19 ENCOUNTER — Emergency Department (INDEPENDENT_AMBULATORY_CARE_PROVIDER_SITE_OTHER): Payer: Medicare Other

## 2015-05-19 ENCOUNTER — Emergency Department (INDEPENDENT_AMBULATORY_CARE_PROVIDER_SITE_OTHER)
Admission: EM | Admit: 2015-05-19 | Discharge: 2015-05-19 | Disposition: A | Discharge status: Home / Self Care | Payer: Medicare Other | Source: Home / Self Care | Attending: Family Medicine | Admitting: Family Medicine

## 2015-05-19 DIAGNOSIS — M2141 Flat foot [pes planus] (acquired), right foot: Secondary | ICD-10-CM

## 2015-05-19 DIAGNOSIS — G809 Cerebral palsy, unspecified: Secondary | ICD-10-CM | POA: Diagnosis not present

## 2015-05-19 DIAGNOSIS — M79671 Pain in right foot: Secondary | ICD-10-CM | POA: Diagnosis not present

## 2015-05-19 DIAGNOSIS — M2142 Flat foot [pes planus] (acquired), left foot: Secondary | ICD-10-CM

## 2015-05-19 NOTE — ED Provider Notes (Signed)
CSN: [id number]     Arrival date & time 05/19/15  1212 History   First MD Initiated Contact with Patient 05/19/15 1300     Chief Complaint  Patient presents with  . Foot Pain   (Consider location/radiation/quality/duration/timing/severity/associated sxs/prior Treatment) HPI   R foot pain started 3 days ago. Pain on bottom of foot but radiates up leg. Feels like a cramp per pt. Feels pulse in leg. No pain if not ambulating. Has not tried anything for the pain. Denies swelling. No trauma to ankle. Gettign worse.   Denies fevers, other joint involvement, effusions, rash, nausea, vomiting, diarrhea, chest pain, shortness of breath, palpitations, change in sensation of toes.   Past Medical History  Diagnosis Date  . CP (cerebral palsy)   . Arthritis    Past Surgical History  Procedure Laterality Date  . Leg surgery    . Tracheostomy    . Ankle fusion     Family History  Problem Relation Age of Onset  . Hypertension Mother    History  Substance Use Topics  . Smoking status: Never Smoker   . Smokeless tobacco: Not on file  . Alcohol Use: No   OB History    No data available     Review of Systems Per HPI with all other pertinent systems negative.   Allergies  Shellfish allergy  Home Medications   Prior to Admission medications   Medication Sig Start Date End Date Taking? Authorizing Provider  ibuprofen (ADVIL,MOTRIN) 800 MG tablet Take 800 mg by mouth every 8 (eight) hours as needed for pain.    Yes Historical Provider, MD  cephALEXin (KEFLEX) 250 MG/5ML suspension Take 10 mLs (500 mg total) by mouth 3 (three) times daily. 02/25/14   Reuben Likesavid C Keller, MD  cephALEXin (KEFLEX) 500 MG capsule Take 1 capsule (500 mg total) by mouth 4 (four) times daily. 07/19/14   Hayden Rasmussenavid Mabe, NP  docusate sodium (COLACE) 100 MG capsule Take 1 capsule (100 mg total) by mouth every 12 (twelve) hours. 06/15/13   Lorre NickAnthony Allen, MD  HYDROcodone-acetaminophen (NORCO) 5-325 MG per tablet Take 1 tablet by  mouth every 6 (six) hours as needed for moderate pain. 04/09/14   Graylon GoodZachary H Baker, PA-C  metroNIDAZOLE (FLAGYL) 500 MG tablet Take 1 tablet (500 mg total) by mouth 2 (two) times daily. X 7 days 07/19/14   Hayden Rasmussenavid Mabe, NP  pantoprazole (PROTONIX) 20 MG tablet Take 1 tablet (20 mg total) by mouth daily. 06/15/13   Lorre NickAnthony Allen, MD  sucralfate (CARAFATE) 1 G tablet Take 1 tablet (1 g total) by mouth 4 (four) times daily. 06/15/13   Lorre NickAnthony Allen, MD  traMADol (ULTRAM) 50 MG tablet Take 50 mg by mouth every 6 (six) hours as needed for pain.     Historical Provider, MD   BP 108/74 mmHg  Pulse 76  Temp(Src) 97.4 F (36.3 C) (Oral)  Resp 16  SpO2 100%  LMP 05/14/2015 (Exact Date) Physical Exam Physical Exam  Constitutional: oriented to person, place, and time. appears well-developed and well-nourished. No distress.  HENT:  Head: Normocephalic and atraumatic.  Eyes: EOMI. PERRL.  Neck: Normal range of motion.  Cardiovascular: RRR, no m/r/g, 2+ distal pulses,  Pulmonary/Chest: Effort normal and breath sounds normal. No respiratory distress.  Abdominal: Soft. Bowel sounds are normal. NonTTP, no distension.  Musculoskeletal: Bilateral foot eversion at baseline. Old surgical wound scar noted in the right lower leg. Mild tenderness with deep palpation at the base of the fifth metatarsal primarily on  the plantar surface. No effusions noted. 2+ pulses..  Neurological: alert and oriented to person, place, and time.  Skin: Skin is warm. No rash noted. non diaphoretic.  Psychiatric: normal mood and affect. behavior is normal. Judgment and thought content normal.   ED Course  Procedures (including critical care time) Labs Review Labs Reviewed - No data to display  Imaging Review No results found.   MDM   1. Foot pain, right   2. Pes planus of both feet   3. Cerebral palsy    Plain film without evidence of fracture. Suspect chronic ongoing changes in the patient's muscle skeletal structure foot  with some baseline pes planus. Patient to use foot strap for comfort and support, increase ibuprofen to 400 600 mg every 6 hours and to start using a new more comfortable sole insert versus a new peer shoes. Follow up with PCP as needed.   Ozella Rocks, MD 05/19/15 [phone]

## 2015-05-19 NOTE — Discharge Instructions (Signed)
There is no sign of fracture or other significant injury to her foot. The pain may be simply due to chronic musculoskeletal foot changes that occur with time. Please use a strap for additional support and start using ibuprofen 400 600 mg every 6 hours for additional pain relief. Consider changing her shoes or at least adding a more comfortable shoe insert to help with your pain and discomfort.

## 2015-05-19 NOTE — ED Notes (Signed)
Pt states she woke up two days ago and had pain in her foot.  She states the pain is in the heel of her foot and hurts in her leg as well.  She states it only hurts when she walks on it.

## 2015-06-03 ENCOUNTER — Ambulatory Visit: Payer: Medicaid Other

## 2015-06-04 ENCOUNTER — Other Ambulatory Visit (HOSPITAL_COMMUNITY): Payer: Self-pay | Admitting: Orthopedic Surgery

## 2015-06-04 DIAGNOSIS — M25571 Pain in right ankle and joints of right foot: Secondary | ICD-10-CM

## 2015-06-12 ENCOUNTER — Ambulatory Visit (HOSPITAL_COMMUNITY): Payer: Medicare Other

## 2015-06-19 ENCOUNTER — Ambulatory Visit (HOSPITAL_COMMUNITY): Admission: RE | Admit: 2015-06-19 | Payer: Medicare Other | Source: Ambulatory Visit

## 2015-06-26 ENCOUNTER — Ambulatory Visit (HOSPITAL_COMMUNITY): Payer: Medicare Other

## 2015-07-31 ENCOUNTER — Ambulatory Visit
Admission: RE | Admit: 2015-07-31 | Discharge: 2015-07-31 | Disposition: A | Discharge status: Home / Self Care | Payer: Medicare Other | Source: Ambulatory Visit

## 2015-07-31 DIAGNOSIS — Z1231 Encounter for screening mammogram for malignant neoplasm of breast: Secondary | ICD-10-CM

## 2015-09-15 ENCOUNTER — Emergency Department (INDEPENDENT_AMBULATORY_CARE_PROVIDER_SITE_OTHER)
Admission: EM | Admit: 2015-09-15 | Discharge: 2015-09-15 | Disposition: A | Discharge status: Home / Self Care | Payer: Medicare Other | Source: Home / Self Care | Attending: Family Medicine | Admitting: Family Medicine

## 2015-09-15 ENCOUNTER — Encounter (HOSPITAL_COMMUNITY): Payer: Self-pay

## 2015-09-15 DIAGNOSIS — R519 Headache, unspecified: Secondary | ICD-10-CM

## 2015-09-15 DIAGNOSIS — R51 Headache: Secondary | ICD-10-CM

## 2015-09-15 MED ORDER — NAPROXEN 375 MG PO TABS: 375.0000 mg | ORAL_TABLET | Freq: Two times a day (BID) | ORAL | Status: DC

## 2015-09-15 NOTE — Discharge Instructions (Signed)
Headaches, Frequently Asked Questions °MIGRAINE HEADACHES °Q: What is migraine? What causes it? How can I treat it? °A: Generally, migraine headaches begin as a dull ache. Then they develop into a constant, throbbing, and pulsating pain. You may experience pain at the temples. You may experience pain at the front or back of one or both sides of the head. The pain is usually accompanied by a combination of: °· Nausea. °· Vomiting. °· Sensitivity to light and noise. °Some people (about 15%) experience an aura (see below) before an attack. The cause of migraine is believed to be chemical reactions in the brain. Treatment for migraine may include over-the-counter or prescription medications. It may also include self-help techniques. These include relaxation training and biofeedback.  °Q: What is an aura? °A: About 15% of people with migraine get an "aura". This is a sign of neurological symptoms that occur before a migraine headache. You may see wavy or jagged lines, dots, or flashing lights. You might experience tunnel vision or blind spots in one or both eyes. The aura can include visual or auditory hallucinations (something imagined). It may include disruptions in smell (such as strange odors), taste or touch. Other symptoms include: °· Numbness. °· A "pins and needles" sensation. °· Difficulty in recalling or speaking the correct word. °These neurological events may last as long as 60 minutes. These symptoms will fade as the headache begins. °Q: What is a trigger? °A: Certain physical or environmental factors can lead to or "trigger" a migraine. These include: °· Foods. °· Hormonal changes. °· Weather. °· Stress. °It is important to remember that triggers are different for everyone. To help prevent migraine attacks, you need to figure out which triggers affect you. Keep a headache diary. This is a good way to track triggers. The diary will help you talk to your healthcare professional about your condition. °Q: Does  weather affect migraines? °A: Bright sunshine, hot, humid conditions, and drastic changes in barometric pressure may lead to, or "trigger," a migraine attack in some people. But studies have shown that weather does not act as a trigger for everyone with migraines. °Q: What is the link between migraine and hormones? °A: Hormones start and regulate many of your body's functions. Hormones keep your body in balance within a constantly changing environment. The levels of hormones in your body are unbalanced at times. Examples are during menstruation, pregnancy, or menopause. That can lead to a migraine attack. In fact, about three quarters of all women with migraine report that their attacks are related to the menstrual cycle.  °Q: Is there an increased risk of stroke for migraine sufferers? °A: The likelihood of a migraine attack causing a stroke is very remote. That is not to say that migraine sufferers cannot have a stroke associated with their migraines. In persons under age 40, the most common associated factor for stroke is migraine headache. But over the course of a person's normal life span, the occurrence of migraine headache may actually be associated with a reduced risk of dying from cerebrovascular disease due to stroke.  °Q: What are acute medications for migraine? °A: Acute medications are used to treat the pain of the headache after it has started. Examples over-the-counter medications, NSAIDs, ergots, and triptans.  °Q: What are the triptans? °A: Triptans are the newest class of abortive medications. They are specifically targeted to treat migraine. Triptans are vasoconstrictors. They moderate some chemical reactions in the brain. The triptans work on receptors in your brain. Triptans help   to restore the balance of a neurotransmitter called serotonin. Fluctuations in levels of serotonin are thought to be a main cause of migraine.  °Q: Are over-the-counter medications for migraine effective? °A:  Over-the-counter, or "OTC," medications may be effective in relieving mild to moderate pain and associated symptoms of migraine. But you should see your caregiver before beginning any treatment regimen for migraine.  °Q: What are preventive medications for migraine? °A: Preventive medications for migraine are sometimes referred to as "prophylactic" treatments. They are used to reduce the frequency, severity, and length of migraine attacks. Examples of preventive medications include antiepileptic medications, antidepressants, beta-blockers, calcium channel blockers, and NSAIDs (nonsteroidal anti-inflammatory drugs). °Q: Why are anticonvulsants used to treat migraine? °A: During the past few years, there has been an increased interest in antiepileptic drugs for the prevention of migraine. They are sometimes referred to as "anticonvulsants". Both epilepsy and migraine may be caused by similar reactions in the brain.  °Q: Why are antidepressants used to treat migraine? °A: Antidepressants are typically used to treat people with depression. They may reduce migraine frequency by regulating chemical levels, such as serotonin, in the brain.  °Q: What alternative therapies are used to treat migraine? °A: The term "alternative therapies" is often used to describe treatments considered outside the scope of conventional Western medicine. Examples of alternative therapy include acupuncture, acupressure, and yoga. Another common alternative treatment is herbal therapy. Some herbs are believed to relieve headache pain. Always discuss alternative therapies with your caregiver before proceeding. Some herbal products contain arsenic and other toxins. °TENSION HEADACHES °Q: What is a tension-type headache? What causes it? How can I treat it? °A: Tension-type headaches occur randomly. They are often the result of temporary stress, anxiety, fatigue, or anger. Symptoms include soreness in your temples, a tightening band-like sensation  around your head (a "vice-like" ache). Symptoms can also include a pulling feeling, pressure sensations, and contracting head and neck muscles. The headache begins in your forehead, temples, or the back of your head and neck. Treatment for tension-type headache may include over-the-counter or prescription medications. Treatment may also include self-help techniques such as relaxation training and biofeedback. °CLUSTER HEADACHES °Q: What is a cluster headache? What causes it? How can I treat it? °A: Cluster headache gets its name because the attacks come in groups. The pain arrives with little, if any, warning. It is usually on one side of the head. A tearing or bloodshot eye and a runny nose on the same side of the headache may also accompany the pain. Cluster headaches are believed to be caused by chemical reactions in the brain. They have been described as the most severe and intense of any headache type. Treatment for cluster headache includes prescription medication and oxygen. °SINUS HEADACHES °Q: What is a sinus headache? What causes it? How can I treat it? °A: When a cavity in the bones of the face and skull (a sinus) becomes inflamed, the inflammation will cause localized pain. This condition is usually the result of an allergic reaction, a tumor, or an infection. If your headache is caused by a sinus blockage, such as an infection, you will probably have a fever. An x-ray will confirm a sinus blockage. Your caregiver's treatment might include antibiotics for the infection, as well as antihistamines or decongestants.  °REBOUND HEADACHES °Q: What is a rebound headache? What causes it? How can I treat it? °A: A pattern of taking acute headache medications too often can lead to a condition known as "rebound headache."   A pattern of taking too much headache medication includes taking it more than 2 days per week or in excessive amounts. That means more than the label or a caregiver advises. With rebound  headaches, your medications not only stop relieving pain, they actually begin to cause headaches. Doctors treat rebound headache by tapering the medication that is being overused. Sometimes your caregiver will gradually substitute a different type of treatment or medication. Stopping may be a challenge. Regularly overusing a medication increases the potential for serious side effects. Consult a caregiver if you regularly use headache medications more than 2 days per week or more than the label advises. °ADDITIONAL QUESTIONS AND ANSWERS °Q: What is biofeedback? °A: Biofeedback is a self-help treatment. Biofeedback uses special equipment to monitor your body's involuntary physical responses. Biofeedback monitors: °· Breathing. °· Pulse. °· Heart rate. °· Temperature. °· Muscle tension. °· Brain activity. °Biofeedback helps you refine and perfect your relaxation exercises. You learn to control the physical responses that are related to stress. Once the technique has been mastered, you do not need the equipment any more. °Q: Are headaches hereditary? °A: Four out of five (80%) of people that suffer report a family history of migraine. Scientists are not sure if this is genetic or a family predisposition. Despite the uncertainty, a child has a 50% chance of having migraine if one parent suffers. The child has a 75% chance if both parents suffer.  °Q: Can children get headaches? °A: By the time they reach high school, most young people have experienced some type of headache. Many safe and effective approaches or medications can prevent a headache from occurring or stop it after it has begun.  °Q: What type of doctor should I see to diagnose and treat my headache? °A: Start with your primary caregiver. Discuss his or her experience and approach to headaches. Discuss methods of classification, diagnosis, and treatment. Your caregiver may decide to recommend you to a headache specialist, depending upon your symptoms or other  physical conditions. Having diabetes, allergies, etc., may require a more comprehensive and inclusive approach to your headache. The [employer] will provide, upon request, a list of NHF physician members in your state. °Document Released: 03/04/2004 Document Revised: 03/06/2012 Document Reviewed: 08/12/2008 °ExitCare® Patient Information ©2015 [employer]. This information is not intended to replace advice given to you by your health care provider. Make sure you discuss any questions you have with your health care provider. ° °General Headache Without Cause °A headache is pain or discomfort felt around the head or neck area. The specific cause of a headache may not be found. There are many causes and types of headaches. A few common ones are: °· Tension headaches. °· Migraine headaches. °· Cluster headaches. °· Chronic daily headaches. °HOME CARE INSTRUCTIONS  °· Keep all follow-up appointments with your caregiver or any specialist referral. °· Only take over-the-counter or prescription medicines for pain or discomfort as directed by your caregiver. °· Lie down in a dark, quiet room when you have a headache. °· Keep a headache journal to find out what may trigger your migraine headaches. For example, write down: °¨ What you eat and drink. °¨ How much sleep you get. °¨ Any change to your diet or medicines. °· Try massage or other relaxation techniques. °· Put ice packs or heat on the head and neck. Use these 3 to 4 times per day for 15 to 20 minutes each time, or as needed. °· Limit stress. °· Sit up   straight, and do not tense your muscles. °· Quit smoking if you smoke. °· Limit alcohol use. °· Decrease the amount of caffeine you drink, or stop drinking caffeine. °· Eat and sleep on a regular schedule. °· Get 7 to 9 hours of sleep, or as recommended by your caregiver. °· Keep lights dim if bright lights bother you and make your headaches worse. °SEEK MEDICAL CARE IF:  °· You have problems with the  medicines you were prescribed. °· Your medicines are not working. °· You have a change from the usual headache. °· You have nausea or vomiting. °SEEK IMMEDIATE MEDICAL CARE IF:  °· Your headache becomes severe. °· You have a fever. °· You have a stiff neck. °· You have loss of vision. °· You have muscular weakness or loss of muscle control. °· You start losing your balance or have trouble walking. °· You feel faint or pass out. °· You have severe symptoms that are different from your first symptoms. °MAKE SURE YOU:  °· Understand these instructions. °· Will watch your condition. °· Will get help right away if you are not doing well or get worse. °Document Released: 12/13/2005 Document Revised: 03/06/2012 Document Reviewed: 12/29/2011 °ExitCare® Patient Information ©2015 [employer]. This information is not intended to replace advice given to you by your health care provider. Make sure you discuss any questions you have with your health care provider. ° °

## 2015-09-15 NOTE — ED Provider Notes (Signed)
CSN: [id number]     Arrival date & time 09/15/15  1802 History   First MD Initiated Contact with Patient 09/15/15 1927     Chief Complaint  Patient presents with  . Back Pain  . Headache   (Consider location/radiation/quality/duration/timing/severity/associated sxs/prior Treatment) HPI Comments: 39 year old female with cerebral palsy presents with a complaint of headache this started today. She points to the middle of the forehead as as the area of pain. No radiation of pain. No associated symptoms. No history of frequent headaches. She states it is not bad now and does not feel like she needs any medicine for it. Denies problems with vision, speech, hearing, swallowing, focal paresthesias or weakness. Denies recent head trauma.   Past Medical History  Diagnosis Date  . CP (cerebral palsy)   . Arthritis    Past Surgical History  Procedure Laterality Date  . Leg surgery    . Tracheostomy    . Ankle fusion     Family History  Problem Relation Age of Onset  . Hypertension Mother    Social History  Substance Use Topics  . Smoking status: Never Smoker   . Smokeless tobacco: None  . Alcohol Use: No   OB History    No data available     Review of Systems  Constitutional: Negative.   Eyes: Negative for photophobia, discharge, redness and visual disturbance.  Respiratory: Negative.   Cardiovascular: Negative for chest pain.  Gastrointestinal: Negative for nausea, vomiting and abdominal pain.  Genitourinary: Negative.   Musculoskeletal: Positive for myalgias and back pain.  Skin: Negative.   Neurological: Positive for headaches. Negative for dizziness, tremors, seizures, syncope, facial asymmetry, speech difficulty, weakness and numbness.  Psychiatric/Behavioral: Negative.     Allergies  Shellfish allergy  Home Medications   Prior to Admission medications   Medication Sig Start Date End Date Taking? Authorizing Provider  docusate sodium (COLACE) 100 MG capsule Take 1  capsule (100 mg total) by mouth every 12 (twelve) hours. 06/15/13   Lorre Nick, MD  naproxen (NAPROSYN) 375 MG tablet Take 1 tablet (375 mg total) by mouth 2 (two) times daily. Prn headache 09/15/15   Hayden Rasmussen, NP  pantoprazole (PROTONIX) 20 MG tablet Take 1 tablet (20 mg total) by mouth daily. 06/15/13   Lorre Nick, MD  sucralfate (CARAFATE) 1 G tablet Take 1 tablet (1 g total) by mouth 4 (four) times daily. 06/15/13   Lorre Nick, MD   Meds Ordered and Administered this Visit  Medications - No data to display  BP 148/91 mmHg  Pulse 84  Temp(Src) 98.2 F (36.8 C) (Oral)  Resp 16  SpO2 100% No data found.   Physical Exam  Constitutional: She is oriented to person, place, and time. She appears well-developed and well-nourished. No distress.  HENT:  Head: Normocephalic and atraumatic.  Mouth/Throat: Oropharynx is clear and moist. No oropharyngeal exudate.  Soft palate rises symmetrically. Uvula and tongue are midline. Swallowing reflex is normal.  Eyes: Conjunctivae and EOM are normal. Pupils are equal, round, and reactive to light.  Neck: Normal range of motion. Neck supple.  Cardiovascular: Normal rate and normal heart sounds.   Pulmonary/Chest: Effort normal and breath sounds normal. No respiratory distress.  Abdominal: Soft. There is no tenderness.  Musculoskeletal: Normal range of motion. She exhibits no edema or tenderness.  Lymphadenopathy:    She has no cervical adenopathy.  Neurological: She is alert and oriented to person, place, and time. She displays no tremor. No cranial nerve deficit  or sensory deficit. She displays no seizure activity. Coordination and gait abnormal.  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 She is able to ambulate with a squatting type gait. Her strength is at baseline. No abnormal findings on neuro exam except for those that are primarily present from CPAP.   Skin:  Skin is warm and dry.  Psychiatric: She has a normal mood and affect.  Nursing note and vitals reviewed.   ED Course  Procedures (including critical care time)  Labs Review Labs Reviewed - No data to display  Imaging Review No results found.   Visual Acuity Review  Right Eye Distance:   Left Eye Distance:   Bilateral Distance:    Right Eye Near:   Left Eye Near:    Bilateral Near:         MDM   1. Acute nonintractable headache, unspecified headache type    Headache instructions Naprosyn bid prn See PCP as needed.Or if new sx's worsening go to the ED   Hayden Rasmussen, NP 09/15/15 1959

## 2015-09-15 NOTE — ED Notes (Signed)
C/o onset HA , back pain today. Has not had any OTC medication for her symptoms

## 2016-02-10 ENCOUNTER — Emergency Department (HOSPITAL_COMMUNITY)
Admission: EM | Admit: 2016-02-10 | Discharge: 2016-02-10 | Disposition: A | Discharge status: Home / Self Care | Payer: Medicare Other | Attending: Emergency Medicine | Admitting: Emergency Medicine

## 2016-02-10 ENCOUNTER — Emergency Department (HOSPITAL_COMMUNITY): Payer: Medicare Other

## 2016-02-10 ENCOUNTER — Encounter (HOSPITAL_COMMUNITY): Payer: Self-pay | Admitting: Neurology

## 2016-02-10 DIAGNOSIS — J069 Acute upper respiratory infection, unspecified: Secondary | ICD-10-CM | POA: Diagnosis not present

## 2016-02-10 DIAGNOSIS — Z8669 Personal history of other diseases of the nervous system and sense organs: Secondary | ICD-10-CM | POA: Insufficient documentation

## 2016-02-10 DIAGNOSIS — Z791 Long term (current) use of non-steroidal anti-inflammatories (NSAID): Secondary | ICD-10-CM | POA: Diagnosis not present

## 2016-02-10 DIAGNOSIS — M1990 Unspecified osteoarthritis, unspecified site: Secondary | ICD-10-CM | POA: Diagnosis not present

## 2016-02-10 DIAGNOSIS — Z79899 Other long term (current) drug therapy: Secondary | ICD-10-CM | POA: Diagnosis not present

## 2016-02-10 DIAGNOSIS — R05 Cough: Secondary | ICD-10-CM | POA: Diagnosis present

## 2016-02-10 DIAGNOSIS — R42 Dizziness and giddiness: Secondary | ICD-10-CM | POA: Diagnosis not present

## 2016-02-10 DIAGNOSIS — B9789 Other viral agents as the cause of diseases classified elsewhere: Secondary | ICD-10-CM

## 2016-02-10 MED ORDER — PROMETHAZINE-DM 6.25-15 MG/5ML PO SYRP: 5.0000 mL | ORAL_SOLUTION | Freq: Four times a day (QID) | ORAL | Status: DC | PRN

## 2016-02-10 MED ORDER — IBUPROFEN 800 MG PO TABS: 800.0000 mg | ORAL_TABLET | Freq: Three times a day (TID) | ORAL | Status: DC | PRN

## 2016-02-10 MED ORDER — GUAIFENESIN ER 1200 MG PO TB12: 1.0000 | ORAL_TABLET | Freq: Two times a day (BID) | ORAL | Status: DC

## 2016-02-10 NOTE — ED Notes (Signed)
Pt verbalized understanding of d/c instructions, prescriptions, and follow-up care. No further questions/concerns, VSS, ambulatory w/ steady gait (refused wheelchair) 

## 2016-02-10 NOTE — ED Notes (Signed)
Pt refusing to wear gown.

## 2016-02-10 NOTE — ED Provider Notes (Signed)
CSN: [id number]     Arrival date & time 02/10/16  4782 History   First MD Initiated Contact with Patient 02/10/16 [phone]     Chief Complaint  Patient presents with  . Generalized Body Aches  . Sore Throat  . Nasal Congestion  . Cough     (Consider location/radiation/quality/duration/timing/severity/associated sxs/prior Treatment) HPI Patient presents to the Emergency Department complaining of body aches, cough and nasal congestion draining out of her trach. The patient has a history of Cerebral Palsey. She states that yesterday she began having a cough, a bad headache, and congestion. She took ibuprofen and NyQuil and was able to sleep throughout the night. However, this morning she woke up with myalgias, sore throat, dysphagia, coughing, and increased mucous-like drainage from her trach. She endorses generalized weakness, lightheadedness, rhinorrhea, and myalgia. She states that she has increased SOB and has difficulty talking. She denies chest pain, palpitations, nausea, vomiting, diarrhea, abdominal pain, syncope, dizziness, fevers, chills, diaphoresis, appetite change, ear pain, epistaxis, wheezing.  Past Medical History  Diagnosis Date  . CP (cerebral palsy) (HCC)   . Arthritis    Past Surgical History  Procedure Laterality Date  . Leg surgery    . Tracheostomy    . Ankle fusion     Family History  Problem Relation Age of Onset  . Hypertension Mother    Social History  Substance Use Topics  . Smoking status: Never Smoker   . Smokeless tobacco: None  . Alcohol Use: No   OB History    No data available     Review of Systems All other systems negative except as documented in the HPI. All pertinent positives and negatives as reviewed in the HPI.   Allergies  Shellfish allergy  Home Medications   Prior to Admission medications   Medication Sig Start Date End Date Taking? Authorizing Provider  docusate sodium (COLACE) 100 MG capsule Take 1 capsule (100 mg total) by  mouth every 12 (twelve) hours. 06/15/13   Lorre Nick, MD  naproxen (NAPROSYN) 375 MG tablet Take 1 tablet (375 mg total) by mouth 2 (two) times daily. Prn headache 09/15/15   Hayden Rasmussen, NP  pantoprazole (PROTONIX) 20 MG tablet Take 1 tablet (20 mg total) by mouth daily. 06/15/13   Lorre Nick, MD  sucralfate (CARAFATE) 1 G tablet Take 1 tablet (1 g total) by mouth 4 (four) times daily. 06/15/13   Lorre Nick, MD   BP 123/71 mmHg  Pulse 99  Temp(Src) 98.8 F (37.1 C) (Oral)  Resp 20  Wt 68.947 kg  SpO2 100%  LMP 01/27/2016 Physical Exam  Constitutional: She is oriented to person, place, and time. She appears well-developed and well-nourished. No distress.  HENT:  Head: Normocephalic and atraumatic.  Right Ear: External ear normal.  Left Ear: External ear normal.  Mouth/Throat: Posterior oropharyngeal erythema present. No oropharyngeal exudate.  Eyes: Conjunctivae and EOM are normal. Pupils are equal, round, and reactive to light.  Neck: Normal range of motion. Neck supple.  Patient has a open trach with mucus drainage present  Cardiovascular: Normal rate, regular rhythm and normal heart sounds.  Exam reveals no gallop and no friction rub.   No murmur heard. Pulmonary/Chest: Effort normal and breath sounds normal. No respiratory distress. She has no wheezes. She has no rales. She exhibits no tenderness.  Abdominal: Soft. Bowel sounds are normal. She exhibits no distension and no mass. There is no tenderness. There is no rebound and no guarding.  Musculoskeletal: Normal range of  motion. She exhibits no edema or tenderness.  Lymphadenopathy:    She has no cervical adenopathy.  Neurological: She is alert and oriented to person, place, and time.  Skin: Skin is warm and dry. No rash noted. She is not diaphoretic. No erythema. No pallor.  Psychiatric: She has a normal mood and affect. Her behavior is normal. Thought content normal.    ED Course  Procedures (including critical care  time) Labs Review Labs Reviewed - No data to display  Imaging Review Dg Chest 2 View  02/10/2016  CLINICAL DATA:  Cough, congestion, shortness of breath for 2 days. EXAM: CHEST  2 VIEW COMPARISON:  06/15/2013 FINDINGS: The heart size and mediastinal contours are within normal limits. Both lungs are clear. The visualized skeletal structures are unremarkable. IMPRESSION: No active cardiopulmonary disease. Electronically Signed   By: Charlett Nose M.D.   On: 02/10/2016 10:16   I have personally reviewed and evaluated these images and lab results as part of my medical decision-making.    Patient is given IV fluids treated for a flulike illness.  Told to return here as needed.  Patient agrees the plan and all questions were answered.  Patient is advised follow-up with her primary care doctor, told to increase her fluid intake   Charlestine Night, PA-C 02/11/16 1928  Blane Ohara, MD 02/14/16 [phone]

## 2016-02-10 NOTE — ED Notes (Signed)
Pt here with cough and secretions draining from her trach. Has nasal congestion and body aches with sore throat.

## 2016-02-10 NOTE — Discharge Instructions (Signed)
Return here as needed. Follow up with your PCP. Increase your fluid intake °

## 2016-02-10 NOTE — ED Notes (Signed)
Pt transported to xray 

## 2016-06-04 ENCOUNTER — Encounter (HOSPITAL_COMMUNITY): Payer: Self-pay | Admitting: Emergency Medicine

## 2016-06-04 ENCOUNTER — Ambulatory Visit (HOSPITAL_COMMUNITY)
Admission: EM | Admit: 2016-06-04 | Discharge: 2016-06-04 | Disposition: A | Discharge status: Home / Self Care | Payer: Medicare Other | Attending: Family Medicine | Admitting: Family Medicine

## 2016-06-04 DIAGNOSIS — M545 Low back pain, unspecified: Secondary | ICD-10-CM

## 2016-06-04 DIAGNOSIS — Z91013 Allergy to seafood: Secondary | ICD-10-CM | POA: Insufficient documentation

## 2016-06-04 DIAGNOSIS — B372 Candidiasis of skin and nail: Secondary | ICD-10-CM | POA: Insufficient documentation

## 2016-06-04 DIAGNOSIS — M791 Myalgia, unspecified site: Secondary | ICD-10-CM

## 2016-06-04 DIAGNOSIS — B359 Dermatophytosis, unspecified: Secondary | ICD-10-CM | POA: Insufficient documentation

## 2016-06-04 DIAGNOSIS — G809 Cerebral palsy, unspecified: Secondary | ICD-10-CM | POA: Insufficient documentation

## 2016-06-04 DIAGNOSIS — Z8249 Family history of ischemic heart disease and other diseases of the circulatory system: Secondary | ICD-10-CM | POA: Diagnosis not present

## 2016-06-04 DIAGNOSIS — Z79899 Other long term (current) drug therapy: Secondary | ICD-10-CM | POA: Diagnosis not present

## 2016-06-04 DIAGNOSIS — N390 Urinary tract infection, site not specified: Secondary | ICD-10-CM | POA: Diagnosis not present

## 2016-06-04 DIAGNOSIS — R51 Headache: Secondary | ICD-10-CM

## 2016-06-04 LAB — POCT URINALYSIS DIP (DEVICE)
Bilirubin Urine: NEGATIVE
Glucose, UA: NEGATIVE mg/dL
HGB URINE DIPSTICK: NEGATIVE
Ketones, ur: NEGATIVE mg/dL
NITRITE: POSITIVE — AB
PH: 6 (ref 5.0–8.0)
PROTEIN: NEGATIVE mg/dL
SPECIFIC GRAVITY, URINE: 1.02 (ref 1.005–1.030)

## 2016-06-04 LAB — POCT PREGNANCY, URINE: Preg Test, Ur: NEGATIVE

## 2016-06-04 MED ORDER — NAPROXEN 375 MG PO TABS: 375.0000 mg | ORAL_TABLET | Freq: Two times a day (BID) | ORAL | Status: DC

## 2016-06-04 MED ORDER — CEPHALEXIN 500 MG PO CAPS: 500.0000 mg | ORAL_CAPSULE | Freq: Four times a day (QID) | ORAL | Status: DC

## 2016-06-04 MED ORDER — CLOTRIMAZOLE-BETAMETHASONE 1-0.05 % EX CREA: TOPICAL_CREAM | CUTANEOUS | Status: DC

## 2016-06-04 NOTE — ED Notes (Signed)
Pt here for UTI sx onset yest associated w/urinary freq/urgency, HA and back pain. Also c/o rash on right hand onset x1 month and rash in the thigh/groin area A&O x4... No acute distress.

## 2016-06-04 NOTE — ED Provider Notes (Signed)
CSN: [id number]     Arrival date & time 06/04/16  1510 History   First MD Initiated Contact with Patient 06/04/16 1626     Chief Complaint  Patient presents with  . Urinary Tract Infection   (Consider location/radiation/quality/duration/timing/severity/associated sxs/prior Treatment) HPI Comments: 40 year old female with a history of cerebral palsy presents to the urgent care with multiple complaints. She is complaining of a headache and back pain started yesterday. The back pain is consistent with her chronic back pain associated with cerebral palsy. She has spinal and hip deformities which calls muscle spasms and muscle pain. This is not a new complaint but an exacerbation. She also has a round rash to the right hand as well as a itchy moist rash to her inner thighs and inguinal. She complains of dysuria and urinary frequency for 1 week. She also complains of decreased hearing and drainage from the ears.   Past Medical History  Diagnosis Date  . CP (cerebral palsy) (HCC)   . Arthritis    Past Surgical History  Procedure Laterality Date  . Leg surgery    . Tracheostomy    . Ankle fusion     Family History  Problem Relation Age of Onset  . Hypertension Mother    Social History  Substance Use Topics  . Smoking status: Never Smoker   . Smokeless tobacco: None  . Alcohol Use: No   OB History    No data available     Review of Systems  Constitutional: Negative for fever, activity change and fatigue.  HENT: Negative for congestion, ear pain, postnasal drip, sore throat and trouble swallowing.   Eyes: Negative.   Respiratory: Negative for cough, chest tightness and shortness of breath.   Cardiovascular: Negative for chest pain.  Gastrointestinal: Negative.   Genitourinary: Positive for dysuria and frequency. Negative for hematuria and pelvic pain.  Musculoskeletal: Positive for myalgias and back pain.  Skin: Positive for rash.  Neurological: Positive for headaches.  All other  systems reviewed and are negative.   Allergies  Shellfish allergy  Home Medications   Prior to Admission medications   Medication Sig Start Date End Date Taking? Authorizing Provider  cephALEXin (KEFLEX) 500 MG capsule Take 1 capsule (500 mg total) by mouth 4 (four) times daily. 06/04/16   Hayden Rasmussen, NP  clotrimazole-betamethasone (LOTRISONE) cream Apply to affected area 2 times daily prn 06/04/16   Hayden Rasmussen, NP  docusate sodium (COLACE) 100 MG capsule Take 1 capsule (100 mg total) by mouth every 12 (twelve) hours. 06/15/13   Lorre Nick, MD  Guaifenesin 1200 MG TB12 Take 1 tablet (1,200 mg total) by mouth 2 (two) times daily. 02/10/16   Charlestine Night, PA-C  naproxen (NAPROSYN) 375 MG tablet Take 1 tablet (375 mg total) by mouth 2 (two) times daily. 06/04/16   Hayden Rasmussen, NP  pantoprazole (PROTONIX) 20 MG tablet Take 1 tablet (20 mg total) by mouth daily. 06/15/13   Lorre Nick, MD  promethazine-dextromethorphan (PROMETHAZINE-DM) 6.25-15 MG/5ML syrup Take 5 mLs by mouth 4 (four) times daily as needed for cough. 02/10/16   Charlestine Night, PA-C  sucralfate (CARAFATE) 1 G tablet Take 1 tablet (1 g total) by mouth 4 (four) times daily. 06/15/13   Lorre Nick, MD   Meds Ordered and Administered this Visit  Medications - No data to display  BP 122/80 mmHg  Pulse 76  Temp(Src) 97.8 F (36.6 C) (Oral)  Resp 20  SpO2 100%  LMP 05/13/2016 No data found.   Physical  Exam  Constitutional: She is oriented to person, place, and time. She appears well-developed and well-nourished. No distress.  HENT:  Head: Normocephalic and atraumatic.  Mouth/Throat: No oropharyngeal exudate.  Bilateral TMs are normal. No effusion or erythema. EACs are clear with exception of small amount of cerumen. Oropharynx is clear and moist.  Eyes: EOM are normal. Pupils are equal, round, and reactive to light.  Neck: Normal range of motion. Neck supple.  Cardiovascular: Normal rate and intact distal pulses.    Pulmonary/Chest: Effort normal and breath sounds normal. No respiratory distress.  Abdominal: Soft. There is no tenderness.  Musculoskeletal: She exhibits tenderness. She exhibits no edema.  Pain in the back is located across the para lumbar musculature where there is tenderness to palpation. Patient is able to get onto and off the exam table with assistance only. This is not a change in condition. Lower extremity strength is unchanged.  Lymphadenopathy:    She has no cervical adenopathy.  Neurological: She is alert and oriented to person, place, and time. She exhibits normal muscle tone.  Skin: Skin is warm and dry. She is not diaphoretic.  Annular and slightly depigmented epidermal lesion to the right hand consistent with tenia.  There is smooth erythema with moist skin and well marginated lesions to the medial thighs and inguinal consistent with intertrigo due to candida.  Psychiatric: She has a normal mood and affect.  Nursing note and vitals reviewed.   ED Course  Procedures (including critical care time)  Labs Review Labs Reviewed  POCT URINALYSIS DIP (DEVICE) - Abnormal; Notable for the following:    Nitrite POSITIVE (*)    Leukocytes, UA SMALL (*)    All other components within normal limits  URINE CULTURE  POCT PREGNANCY, URINE   Results for orders placed or performed during the hospital encounter of 06/04/16  POCT urinalysis dip (device)  Result Value Ref Range   Glucose, UA NEGATIVE NEGATIVE mg/dL   Bilirubin Urine NEGATIVE NEGATIVE   Ketones, ur NEGATIVE NEGATIVE mg/dL   Specific Gravity, Urine 1.020 1.005 - 1.030   Hgb urine dipstick NEGATIVE NEGATIVE   pH 6.0 5.0 - 8.0   Protein, ur NEGATIVE NEGATIVE mg/dL   Urobilinogen, UA >=1.6 0.0 - 1.0 mg/dL   Nitrite POSITIVE (A) NEGATIVE   Leukocytes, UA SMALL (A) NEGATIVE  Pregnancy, urine POC  Result Value Ref Range   Preg Test, Ur NEGATIVE NEGATIVE    Imaging Review No results found.   Visual Acuity  Review  Right Eye Distance:   Left Eye Distance:   Bilateral Distance:    Right Eye Near:   Left Eye Near:    Bilateral Near:         MDM   1. UTI (lower urinary tract infection)   2. Ringworm   3. Candidal intertrigo   4. Bilateral low back pain without sciatica   5. Muscle pain   6. Cerebral palsy, unspecified type (HCC)    Meds ordered this encounter  Medications  . naproxen (NAPROSYN) 375 MG tablet    Sig: Take 1 tablet (375 mg total) by mouth 2 (two) times daily.    Dispense:  20 tablet    Refill:  0    Order Specific Question:  Supervising Provider    Answer:  Linna Hoff [phone]  . cephALEXin (KEFLEX) 500 MG capsule    Sig: Take 1 capsule (500 mg total) by mouth 4 (four) times daily.    Dispense:  28 capsule  Refill:  0    Order Specific Question:  Supervising Provider    Answer:  Linna HoffKINDL, JAMES D [phone][[id number]]  . clotrimazole-betamethasone (LOTRISONE) cream    Sig: Apply to affected area 2 times daily prn    Dispense:  30 g    Refill:  0    Order Specific Question:  Supervising Provider    Answer:  Linna HoffKINDL, JAMES D [phone][[id number]]   Lots of water. Meds as directed. Must f/u with your PCP next week for check up. Urine culture pending.   Hayden Rasmussenavid Osiel Stick, NP 06/04/16 1712

## 2016-06-04 NOTE — Discharge Instructions (Signed)
Antibiotic Medicine °Antibiotic medicines are used to treat infections caused by bacteria. They work by injuring or killing the bacteria that is making you sick. °HOW IS AN ANTIBIOTIC CHOSEN? °An antibiotic is chosen based on many factors. To help your health care provider choose one for you, tell your health care provider if: °· You have any allergies. °· You are pregnant or plan to get pregnant. °· You are breastfeeding. °· You are taking any medicines. These include over-the-counter medicines, prescription medicines, and herbal remedies. °· You have a medical condition or problem you have not already discussed. °Your health care provider will also consider: °· How often the medicine has to be taken. °· Common side effects of the medicine. °· The cost of the medicine. °· The taste of the medicine. °If you have questions about why an antibiotic was chosen, make sure to ask. °FOR HOW LONG SHOULD I TAKE MY ANTIBIOTIC? °Continue to take your antibiotic for as long as told by your health care provider. Do not stop taking it when you feel better. If you stop taking it too soon: °· You may start to feel sick again. °· Your infection may become harder to treat. °· Complications may develop. °WHAT IF I MISS A DOSE? °Try not to miss any doses of medicine. If you miss a dose, take it as soon as possible. However, if it is almost time for the next dose: °· If you are taking 2 doses per day, take the missed dose and the next dose 5 to 6 hours apart. °· If you are taking 3 or more doses per day, take the missed dose and the next dose 2 to 4 hours apart, then go back to the normal schedule. °If you cannot make up a missed dose, take the next scheduled dose on time. Then take the missed dose after you have taken all the doses as recommended by your health care provider, as if you had one more dose left. °DO ANTIBIOTICS AFFECT BIRTH CONTROL? °Birth control pills may not work while you are on antibiotics. If you are taking birth  control pills, continue taking them as usual and use a second form of birth control, such as a condom, to avoid unwanted pregnancy. Continue using the second form of birth control until you are finished with your current 1 month cycle of birth control pills. °OTHER INFORMATION °· If there is any medicine left over, throw it away. °· Never take someone else's antibiotics. °· Never take leftover antibiotics. °SEEK MEDICAL CARE IF: °· You get worse. °· You do not feel better within a few days of starting the antibiotic medicine. °· You vomit. °· White patches appear in your mouth. °· You have new joint pain that begins after starting the antibiotic. °· You have new muscle aches that begin after starting the antibiotic. °· You had a fever before starting the antibiotic and it returns. °· You have any symptoms of an allergic reaction, such as an itchy rash. If this happens, stop taking the antibiotic. °SEEK IMMEDIATE MEDICAL CARE IF: °· Your urine turns dark or becomes blood-colored. °· Your skin turns yellow. °· You bruise or bleed easily. °· You have severe diarrhea and abdominal cramps. °· You have a severe headache. °· You have signs of a severe allergic reaction, such as: °¨ Trouble breathing. °¨ Wheezing. °¨ Swelling of the lips, tongue, or face. °¨ Fainting. °¨ Blisters on the skin or in the mouth. °If you have signs of a severe allergic   reaction, stop taking the antibiotic right away.   This information is not intended to replace advice given to you by your health care provider. Make sure you discuss any questions you have with your health care provider.   Document Released: 08/25/2004 Document Revised: 09/03/2015 Document Reviewed: 04/30/2015 Elsevier Interactive Patient Education 2016 Elsevier Inc.  Back Pain, Adult Back pain is very common in adults.The cause of back pain is rarely dangerous and the pain often gets better over time.The cause of your back pain may not be known. Some common causes of  back pain include:  Strain of the muscles or ligaments supporting the spine.  Wear and tear (degeneration) of the spinal disks.  Arthritis.  Direct injury to the back. For many people, back pain may return. Since back pain is rarely dangerous, most people can learn to manage this condition on their own. HOME CARE INSTRUCTIONS Watch your back pain for any changes. The following actions may help to lessen any discomfort you are feeling:  Remain active. It is stressful on your back to sit or stand in one place for long periods of time. Do not sit, drive, or stand in one place for more than 30 minutes at a time. Take short walks on even surfaces as soon as you are able.Try to increase the length of time you walk each day.  Exercise regularly as directed by your health care provider. Exercise helps your back heal faster. It also helps avoid future injury by keeping your muscles strong and flexible.  Do not stay in bed.Resting more than 1-2 days can delay your recovery.  Pay attention to your body when you bend and lift. The most comfortable positions are those that put less stress on your recovering back. Always use proper lifting techniques, including:  Bending your knees.  Keeping the load close to your body.  Avoiding twisting.  Find a comfortable position to sleep. Use a firm mattress and lie on your side with your knees slightly bent. If you lie on your back, put a pillow under your knees.  Avoid feeling anxious or stressed.Stress increases muscle tension and can worsen back pain.It is important to recognize when you are anxious or stressed and learn ways to manage it, such as with exercise.  Take medicines only as directed by your health care provider. Over-the-counter medicines to reduce pain and inflammation are often the most helpful.Your health care provider may prescribe muscle relaxant drugs.These medicines help dull your pain so you can more quickly return to your normal  activities and healthy exercise.  Apply ice to the injured area:  Put ice in a plastic bag.  Place a towel between your skin and the bag.  Leave the ice on for 20 minutes, 2-3 times a day for the first 2-3 days. After that, ice and heat may be alternated to reduce pain and spasms.  Maintain a healthy weight. Excess weight puts extra stress on your back and makes it difficult to maintain good posture. SEEK MEDICAL CARE IF:  You have pain that is not relieved with rest or medicine.  You have increasing pain going down into the legs or buttocks.  You have pain that does not improve in one week.  You have night pain.  You lose weight.  You have a fever or chills. SEEK IMMEDIATE MEDICAL CARE IF:   You develop new bowel or bladder control problems.  You have unusual weakness or numbness in your arms or legs.  You develop nausea or  vomiting.  You develop abdominal pain.  You feel faint.   This information is not intended to replace advice given to you by your health care provider. Make sure you discuss any questions you have with your health care provider.   Document Released: 12/13/2005 Document Revised: 01/03/2015 Document Reviewed: 04/16/2014 Elsevier Interactive Patient Education 2016 Elsevier Inc.  Muscle Pain, Adult Muscle pain (myalgia) may be caused by many things, including:  Overuse or muscle strain, especially if you are not in shape. This is the most common cause of muscle pain.  Injury.  Bruises.  Viruses, such as the flu.  Infectious diseases.  Fibromyalgia, which is a chronic condition that causes muscle tenderness, fatigue, and headache.  Autoimmune diseases, including lupus.  Certain drugs, including ACE inhibitors and statins. Muscle pain may be mild or severe. In most cases, the pain lasts only a short time and goes away without treatment. To diagnose the cause of your muscle pain, your health care provider will take your medical history. This  means he or she will ask you when your muscle pain began and what has been happening. If you have not had muscle pain for very long, your health care provider may want to wait before doing much testing. If your muscle pain has lasted a long time, your health care provider may want to run tests right away. If your health care provider thinks your muscle pain may be caused by illness, you may need to have additional tests to rule out certain conditions.  Treatment for muscle pain depends on the cause. Home care is often enough to relieve muscle pain. Your health care provider may also prescribe anti-inflammatory medicine. HOME CARE INSTRUCTIONS Watch your condition for any changes. The following actions may help to lessen any discomfort you are feeling:  Only take over-the-counter or prescription medicines as directed by your health care provider.  Apply ice to the sore muscle:  Put ice in a plastic bag.  Place a towel between your skin and the bag.  Leave the ice on for 15-20 minutes, 3-4 times a day.  You may alternate applying hot and cold packs to the muscle as directed by your health care provider.  If overuse is causing your muscle pain, slow down your activities until the pain goes away.  Remember that it is normal to feel some muscle pain after starting a workout program. Muscles that have not been used often will be sore at first.  Do regular, gentle exercises if you are not usually active.  Warm up before exercising to lower your risk of muscle pain.  Do not continue working out if the pain is very bad. Bad pain could mean you have injured a muscle. SEEK MEDICAL CARE IF:  Your muscle pain gets worse, and medicines do not help.  You have muscle pain that lasts longer than 3 days.  You have a rash or fever along with muscle pain.  You have muscle pain after a tick bite.  You have muscle pain while working out, even though you are in good physical condition.  You have  redness, soreness, or swelling along with muscle pain.  You have muscle pain after starting a new medicine or changing the dose of a medicine. SEEK IMMEDIATE MEDICAL CARE IF:  You have trouble breathing.  You have trouble swallowing.  You have muscle pain along with a stiff neck, fever, and vomiting.  You have severe muscle weakness or cannot move part of your body. MAKE SURE  YOU:   Understand these instructions.  Will watch your condition.  Will get help right away if you are not doing well or get worse.   This information is not intended to replace advice given to you by your health care provider. Make sure you discuss any questions you have with your health care provider.   Document Released: 11/04/2006 Document Revised: 01/03/2015 Document Reviewed: 10/09/2013 Elsevier Interactive Patient Education 2016 Elsevier Inc.  Urinary Tract Infection A urinary tract infection (UTI) can occur any place along the urinary tract. The tract includes the kidneys, ureters, bladder, and urethra. A type of germ called bacteria often causes a UTI. UTIs are often helped with antibiotic medicine.  HOME CARE   If given, take antibiotics as told by your doctor. Finish them even if you start to feel better.  Drink enough fluids to keep your pee (urine) clear or pale yellow.  Avoid tea, drinks with caffeine, and bubbly (carbonated) drinks.  Pee often. Avoid holding your pee in for a long time.  Pee before and after having sex (intercourse).  Wipe from front to back after you poop (bowel movement) if you are a woman. Use each tissue only once. GET HELP RIGHT AWAY IF:   You have back pain.  You have lower belly (abdominal) pain.  You have chills.  You feel sick to your stomach (nauseous).  You throw up (vomit).  Your burning or discomfort with peeing does not go away.  You have a fever.  Your symptoms are not better in 3 days. MAKE SURE YOU:   Understand these  instructions.  Will watch your condition.  Will get help right away if you are not doing well or get worse.   This information is not intended to replace advice given to you by your health care provider. Make sure you discuss any questions you have with your health care provider.   Document Released: 05/31/2008 Document Revised: 01/03/2015 Document Reviewed: 07/13/2012 Elsevier Interactive Patient Education 2016 [employer].  Cutaneous Candidiasis Cutaneous candidiasis is a condition in which there is an overgrowth of yeast (candida) on the skin. Yeast normally live on the skin, but in small enough numbers not to cause any symptoms. In certain cases, increased growth of the yeast may cause an actual yeast infection. This kind of infection usually occurs in areas of the skin that are constantly warm and moist, such as the armpits or the groin. Yeast is the most common cause of diaper rash in babies and in people who cannot control their bowel movements (incontinence). CAUSES  The fungus that most often causes cutaneous candidiasis is Candida albicans. Conditions that can increase the risk of getting a yeast infection of the skin include:  Obesity.  Pregnancy.  Diabetes.  Taking antibiotic medicine.  Taking birth control pills.  Taking steroid medicines.  Thyroid disease.  An iron or zinc deficiency.  Problems with the immune system. SYMPTOMS   Red, swollen area of the skin.  Bumps on the skin.  Itchiness. DIAGNOSIS  The diagnosis of cutaneous candidiasis is usually based on its appearance. Light scrapings of the skin may also be taken and viewed under a microscope to identify the presence of yeast. TREATMENT  Antifungal creams may be applied to the infected skin. In severe cases, oral medicines may be needed.  HOME CARE INSTRUCTIONS   Keep your skin clean and dry.  Maintain a healthy weight.  If you have diabetes, keep your blood sugar under control. SEEK IMMEDIATE  MEDICAL  CARE IF:  Your rash continues to spread despite treatment.  You have a fever, chills, or abdominal pain.   This information is not intended to replace advice given to you by your health care provider. Make sure you discuss any questions you have with your health care provider.   Document Released: 08/31/2011 Document Revised: 03/06/2012 Document Reviewed: 06/16/2015 Elsevier Interactive Patient Education [employer]

## 2016-06-06 LAB — URINE CULTURE: Special Requests: NORMAL

## 2016-09-14 ENCOUNTER — Other Ambulatory Visit: Payer: Self-pay | Admitting: Nurse Practitioner

## 2016-09-14 DIAGNOSIS — Z1231 Encounter for screening mammogram for malignant neoplasm of breast: Secondary | ICD-10-CM

## 2016-09-17 ENCOUNTER — Ambulatory Visit: Payer: Medicare Other

## 2016-11-08 ENCOUNTER — Ambulatory Visit: Payer: Medicare Other

## 2016-11-12 ENCOUNTER — Ambulatory Visit
Admission: RE | Admit: 2016-11-12 | Discharge: 2016-11-12 | Disposition: A | Discharge status: Home / Self Care | Payer: Medicare Other | Source: Ambulatory Visit | Attending: Nurse Practitioner | Admitting: Nurse Practitioner

## 2016-11-12 DIAGNOSIS — Z1231 Encounter for screening mammogram for malignant neoplasm of breast: Secondary | ICD-10-CM

## 2017-02-11 ENCOUNTER — Ambulatory Visit (INDEPENDENT_AMBULATORY_CARE_PROVIDER_SITE_OTHER): Payer: Medicare Other | Admitting: Orthopedic Surgery

## 2017-02-11 ENCOUNTER — Ambulatory Visit (INDEPENDENT_AMBULATORY_CARE_PROVIDER_SITE_OTHER): Payer: Medicare Other

## 2017-02-11 ENCOUNTER — Encounter (INDEPENDENT_AMBULATORY_CARE_PROVIDER_SITE_OTHER): Payer: Self-pay | Admitting: Orthopedic Surgery

## 2017-02-11 VITALS — Ht 62.0 in | Wt 152.0 lb

## 2017-02-11 DIAGNOSIS — M25374 Other instability, right foot: Secondary | ICD-10-CM

## 2017-02-11 DIAGNOSIS — M25571 Pain in right ankle and joints of right foot: Secondary | ICD-10-CM

## 2017-02-11 MED ORDER — METHYLPREDNISOLONE ACETATE 40 MG/ML IJ SUSP
40.0000 mg | INTRAMUSCULAR | Status: AC | PRN
  Administered 2017-02-11: 40 mg via INTRA_ARTICULAR

## 2017-02-11 MED ORDER — LIDOCAINE HCL 1 % IJ SOLN
1.0000 mL | INTRAMUSCULAR | Status: AC | PRN
  Administered 2017-02-11: 1 mL

## 2017-02-11 NOTE — Progress Notes (Signed)
Office Visit Note   Patient: Amber Juarez           Date of Birth: 12/28/1975           MRN: [id number] Visit Date: 02/11/2017              Requested by: Lenoria ChimeValerie A White, FNP [address] [place], [place] [id number] PCP: Lenoria ChimeWhite, Valerie A, FNP  Chief Complaint  Patient presents with  . Right Ankle - Pain    HPI: Patient is a 41 y.o female who presents with right ankle pain. Patient has cerebral palsy with flexion contractures bilaterally. She states she is unable to walk on right ankle due to her pain. Patient has no known injury and states pain has been ongoing for three weeks. Donalee CitrinStepheney L Peele, RT  Patient complains of pain in the sinus Tarsi region with ambulation.  Assessment & Plan: Visit Diagnoses:  1. Pain in right ankle and joints of right foot   2. Instability of subtalar joint, right     Plan: The subtalar joint was injected through the sinus Tarsi without complications. Plan to follow-up in 3 weeks. If she is still symptomatic would need to consider subtalar fusion. She is asymptomatic at the talonavicular joint.  Follow-Up Instructions: Return in about 3 weeks (around 03/04/2017).   Ortho Exam On examination patient is alert oriented no adenopathy well-dressed normal affect normal respiratory effort she does have an antalgic gait. Examination the right foot she has good pulses her foot is plantigrade no plantar ulcers no venous stasis ulcers. She has pain to palpation over the sinus Tarsi. She has good range of motion the ankle which is asymptomatic she has essentially no subtalar motion. Patient has no pain to palpation over the talonavicular joint.  Imaging: Xr Ankle Complete Right  Result Date: 02/11/2017 Three-view radiographs of the right ankle shows a congruent tibial talar joint. She has complete collapse through the subtalar joint and beaking of the talonavicular joint.   Orders:  Orders Placed This Encounter  Procedures  . XR Ankle Complete Right    No orders of the defined types were placed in this encounter.    Procedures: Small Joint Inj Date/Time: 02/11/2017 10:27 AM Performed by: Maliha Outten V Authorized by: Nadara MustardUDA, Maylea Soria V   Consent Given by:  Patient Site marked: the procedure site was marked   Timeout: prior to procedure the correct patient, procedure, and site was verified   Indications:  Pain and diagnostic evaluation Location:  Foot Site:  R subtalar Prep: patient was prepped and draped in usual sterile fashion   Needle Size:  22 G Spinal Needle: No   Approach:  Dorsal Ultrasound Guided: No   Fluoroscopic Guidance: No   Medications:  1 mL lidocaine 1 %; 40 mg methylPREDNISolone acetate 40 MG/ML Aspiration Attempted: No   Patient tolerance:  Patient tolerated the procedure well with no immediate complications    Clinical Data: No additional findings.  Subjective: Review of Systems  Objective: Vital Signs: Ht 5\' 2"  (1.575 m)   Wt 152 lb (68.9 kg)   BMI 27.80 kg/m   Specialty Comments:  No specialty comments available.  PMFS History: Patient Active Problem List   Diagnosis Date Noted  . 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 Arthritis   . CP (cerebral palsy) (HCC)     Family History  Problem Relation Age of Onset  . Hypertension Mother  Past Surgical History:  Procedure Laterality Date  . ANKLE FUSION    . LEG SURGERY    . TRACHEOSTOMY     Social History   Occupational History  . Not on file.   Social History Main Topics  . Smoking status: Never Smoker  . Smokeless tobacco: Never Used  . Alcohol use No  . Drug use: No  . Sexual activity: Yes    Birth control/ protection: None

## 2017-03-04 ENCOUNTER — Ambulatory Visit (INDEPENDENT_AMBULATORY_CARE_PROVIDER_SITE_OTHER): Payer: Medicare Other | Admitting: Orthopedic Surgery

## 2017-04-18 ENCOUNTER — Ambulatory Visit (HOSPITAL_COMMUNITY)
Admission: EM | Admit: 2017-04-18 | Discharge: 2017-04-18 | Disposition: A | Discharge status: Home / Self Care | Payer: Medicare Other | Attending: Family Medicine | Admitting: Family Medicine

## 2017-04-18 ENCOUNTER — Encounter (HOSPITAL_COMMUNITY): Payer: Self-pay | Admitting: Emergency Medicine

## 2017-04-18 DIAGNOSIS — R0789 Other chest pain: Secondary | ICD-10-CM | POA: Diagnosis not present

## 2017-04-18 MED ORDER — DICLOFENAC SODIUM 75 MG PO TBEC: 75.0000 mg | DELAYED_RELEASE_TABLET | Freq: Two times a day (BID) | ORAL | 0 refills | Status: DC

## 2017-04-18 NOTE — Discharge Instructions (Signed)
Your chest wall pain is most likely musculoskeletal. Your EKG findings were normal and appropriate. Started on diclofenac, take one tablet twice a day. Should your symptoms persist, or fail to perform resolve, follow up with your primary care provider. If it any time your symptoms worsen go to the emergency room.

## 2017-04-18 NOTE — ED Triage Notes (Signed)
The patient presented to the [facility] with a complaint of left side and rib pain that started today. The patient denied any known injury.

## 2017-04-18 NOTE — ED Provider Notes (Signed)
CSN: [id number]     Arrival date & time 04/18/17  1755 History   First MD Initiated Contact with Patient 04/18/17 1926     Chief Complaint  Patient presents with  . Chest Pain   (Consider location/radiation/quality/duration/timing/severity/associated sxs/prior Treatment) 41 year old female presents to clinic for chest wall pain starting today, worse with movement and deep inspiration.   The history is provided by the patient.  Chest Pain  Pain location:  L chest Pain quality: sharp and stabbing   Pain radiates to:  Does not radiate Pain severity:  Mild Onset quality:  Sudden Duration:  1 day Timing:  Constant Progression:  Unchanged Chronicity:  New Context: breathing, movement and raising an arm   Relieved by:  Certain positions Worsened by:  Deep breathing, coughing and movement Ineffective treatments:  None tried Associated symptoms: no abdominal pain, no cough, no dizziness, no fatigue, no fever, no headache, no heartburn, no lower extremity edema, no nausea, no near-syncope, no numbness, no orthopnea, no palpitations, no shortness of breath, no vomiting and no weakness   Risk factors: no coronary artery disease, no diabetes mellitus, no high cholesterol, no hypertension and no smoking     Past Medical History:  Diagnosis Date  . Arthritis   . CP (cerebral palsy) [facility])    Past Surgical History:  Procedure Laterality Date  . ANKLE FUSION    . LEG SURGERY    . TRACHEOSTOMY     Family History  Problem Relation Age of Onset  . Hypertension Mother    Social History  Substance Use Topics  . Smoking status: Never Smoker  . Smokeless tobacco: Never Used  . Alcohol use No   OB History    No data available     Review of Systems  Constitutional: Negative for fatigue and fever.  HENT: Negative.   Eyes: Negative.   Respiratory: Negative for cough and shortness of breath.   Cardiovascular: Positive for chest pain. Negative for palpitations, orthopnea and  near-syncope.  Gastrointestinal: Negative for abdominal pain, heartburn, nausea and vomiting.  Musculoskeletal: Negative.   Skin: Negative.   Neurological: Negative for dizziness, weakness, numbness and headaches.  All other systems reviewed and are negative.   Allergies  Shellfish allergy  Home Medications   Prior to Admission medications   Medication Sig Start Date End Date Taking? Authorizing Provider  diclofenac (VOLTAREN) 75 MG EC tablet Take 1 tablet (75 mg total) by mouth 2 (two) times daily. 04/18/17   Dorena Bodo, NP   Meds Ordered and Administered this Visit  Medications - No data to display  BP 106/82 (BP Location: Right Arm)   Pulse 81   Temp 97.7 F (36.5 C) (Oral)   Resp 20   SpO2 100%  No data found.   Physical Exam  Constitutional: She is oriented to person, place, and time. She appears well-developed and well-nourished. No distress.  HENT:  Head: Normocephalic and atraumatic.  Right Ear: External ear normal.  Left Ear: External ear normal.  Eyes: Conjunctivae are normal. Right eye exhibits no discharge. Left eye exhibits no discharge.  Neck: Normal range of motion.  Cardiovascular: Normal rate and regular rhythm.   Pulmonary/Chest: Effort normal and breath sounds normal.  Abdominal: Bowel sounds are normal.  Neurological: She is alert and oriented to person, place, and time.  Skin: Skin is warm and dry. Capillary refill takes less than 2 seconds. No rash noted. She is not diaphoretic. No erythema.  Psychiatric: She has a normal mood and  affect. Her behavior is normal.  Nursing note and vitals reviewed.   Urgent Care Course     ED EKG Date/Time: 04/18/2017 9:19 PM Performed by: Dorena Bodo Authorized by: Dorena Bodo   ECG reviewed by ED Physician in the absence of a cardiologist: no   Previous ECG:    Previous ECG:  Unavailable Interpretation:    Interpretation: normal   Rate:    ECG rate:  77   ECG rate assessment: normal    Rhythm:    Rhythm: sinus rhythm and A-V block   Ectopy:    Ectopy: none   QRS:    QRS axis:  Normal Conduction:    Conduction: normal   ST segments:    ST segments:  Normal T waves:    T waves: normal   Comments:     PR 202 QRS 70 ms QT/QTc 360/407 ms  Sinus Rhythm with 1st degree AV block   (including critical care time)  Labs Review Labs Reviewed - No data to display  Imaging Review No results found.      MDM   1. Chest wall pain    Low index of suspicion of this being cardiac chest pain. Chest pain reproducible with movement, palpation. 12-lead findings unremarkable with no ST elevation. Treating with anti-inflammatories, recommend following up with primary care provider for further evaluation. Follow-up guidelines given to go to the emergency room at any time symptoms worsen    Dorena Bodo, NP 04/18/17 2122

## 2017-05-26 ENCOUNTER — Ambulatory Visit (HOSPITAL_COMMUNITY)
Admission: EM | Admit: 2017-05-26 | Discharge: 2017-05-26 | Disposition: A | Discharge status: Home / Self Care | Payer: Medicare Other | Attending: Internal Medicine | Admitting: Internal Medicine

## 2017-05-26 ENCOUNTER — Encounter (HOSPITAL_COMMUNITY): Payer: Self-pay | Admitting: *Deleted

## 2017-05-26 DIAGNOSIS — R300 Dysuria: Secondary | ICD-10-CM | POA: Diagnosis not present

## 2017-05-26 DIAGNOSIS — R350 Frequency of micturition: Secondary | ICD-10-CM

## 2017-05-26 DIAGNOSIS — B3731 Acute candidiasis of vulva and vagina: Secondary | ICD-10-CM

## 2017-05-26 DIAGNOSIS — B373 Candidiasis of vulva and vagina: Secondary | ICD-10-CM

## 2017-05-26 MED ORDER — CEPHALEXIN 500 MG PO CAPS: 500.0000 mg | ORAL_CAPSULE | Freq: Four times a day (QID) | ORAL | 0 refills | Status: DC

## 2017-05-26 MED ORDER — FLUCONAZOLE 150 MG PO TABS: 150.0000 mg | ORAL_TABLET | Freq: Every day | ORAL | 0 refills | Status: DC

## 2017-05-26 NOTE — ED Notes (Signed)
Patient still unable to void.

## 2017-05-26 NOTE — ED Triage Notes (Addendum)
Patient reports foul vaginal smell, blood in urine, and itching to vaginal region. Denies polyuria. Patient states left sided lower quadrant pain started today.

## 2017-05-26 NOTE — ED Provider Notes (Signed)
CSN: [id number]     Arrival date & time 05/26/17  1922 History   None    Chief Complaint  Patient presents with  . Urinary Tract Infection   (Consider location/radiation/quality/duration/timing/severity/associated sxs/prior Treatment) Patient c/o white vaginal discharge and vaginal itching.  She c/o dysuria.   The history is provided by the patient.  Urinary Tract Infection  Pain quality:  Aching Pain severity:  Mild Onset quality:  Sudden Duration:  2 days Timing:  Constant Progression:  Worsening Associated symptoms: vaginal discharge     Past Medical History:  Diagnosis Date  . Arthritis   . CP (cerebral palsy) [facility](HCC)    Past Surgical History:  Procedure Laterality Date  . ANKLE FUSION    . LEG SURGERY    . TRACHEOSTOMY     Family History  Problem Relation Age of Onset  . Hypertension Mother    Social History  Substance Use Topics  . Smoking status: Never Smoker  . Smokeless tobacco: Never Used  . Alcohol use No   OB History    No data available     Review of Systems  Constitutional: Negative.   HENT: Negative.   Eyes: Negative.   Respiratory: Negative.   Cardiovascular: Negative.   Gastrointestinal: Negative.   Endocrine: Negative.   Genitourinary: Positive for dysuria and vaginal discharge.  Allergic/Immunologic: Negative.   Neurological: Negative.   Hematological: Negative.   Psychiatric/Behavioral: Negative.     Allergies  Shellfish allergy  Home Medications   Prior to Admission medications   Medication Sig Start Date End Date Taking? Authorizing Provider  cephALEXin (KEFLEX) 500 MG capsule Take 1 capsule (500 mg total) by mouth 4 (four) times daily. 05/26/17   Deatra Canterxford, Lexy Meininger J, FNP  diclofenac (VOLTAREN) 75 MG EC tablet Take 1 tablet (75 mg total) by mouth 2 (two) times daily. 04/18/17   Dorena BodoKennard, Lawrence, NP   Meds Ordered and Administered this Visit  Medications - No data to display  BP 110/65 (BP Location: Left Arm)   Pulse 88    Temp 98.1 F (36.7 C) (Oral)   Resp 14   SpO2 100%  No data found.   Physical Exam  Constitutional: She is oriented to person, place, and time. She appears well-developed and well-nourished.  HENT:  Head: Normocephalic and atraumatic.  Eyes: Conjunctivae and EOM are normal. Pupils are equal, round, and reactive to light.  Neck: Normal range of motion. Neck supple.  Cardiovascular: Normal rate, regular rhythm and normal heart sounds.   Pulmonary/Chest: Effort normal and breath sounds normal.  Abdominal: Soft. Bowel sounds are normal.  Neurological: She is alert and oriented to person, place, and time.  Nursing note and vitals reviewed.   Urgent Care Course     Procedures (including critical care time)  Labs Review Labs Reviewed - No data to display  Imaging Review No results found.   Visual Acuity Review  Right Eye Distance:   Left Eye Distance:   Bilateral Distance:    Right Eye Near:   Left Eye Near:    Bilateral Near:         MDM   1. Dysuria   2. Urinary frequency    Patient declines exam Patient unable to provide Urine Specimen Follow up with Urine Specimen  Keflex 500mg  one po qid x 7 days #28 Diflucan 150mg  one po qd #2      Deatra CanterOxford, Salisa Broz J, [facility]NP 05/26/17 2029

## 2017-07-03 ENCOUNTER — Ambulatory Visit (HOSPITAL_COMMUNITY)
Admission: EM | Admit: 2017-07-03 | Discharge: 2017-07-03 | Disposition: A | Discharge status: Home / Self Care | Payer: Medicare Other | Attending: Family Medicine | Admitting: Family Medicine

## 2017-07-03 ENCOUNTER — Encounter (HOSPITAL_COMMUNITY): Payer: Self-pay | Admitting: *Deleted

## 2017-07-03 DIAGNOSIS — K219 Gastro-esophageal reflux disease without esophagitis: Secondary | ICD-10-CM

## 2017-07-03 MED ORDER — OMEPRAZOLE 20 MG PO CPDR: 20.0000 mg | DELAYED_RELEASE_CAPSULE | Freq: Two times a day (BID) | ORAL | 0 refills | Status: DC

## 2017-07-03 NOTE — ED Provider Notes (Signed)
[facility]    CSN: [id number] Arrival date & time: 07/03/17  1248     History   Chief Complaint Chief Complaint  Patient presents with  . Abdominal Pain    HPI Amber Juarez is a 41 y.o. female.   HPI  Patient presents with a several day history of burning in her upper abdominal area. Sheis worse every time she eats. She has a history of reflux and states that it feels like that. She used to be on medicine, however has been out. Her [place] care provider is located in [place] and she is not been there recently. She has yet to set up with anybody in the area. She denies any nausea, vomiting, fevers, diarrhea, constipation, or bleeding.  Past Medical History:  Diagnosis Date  . Arthritis   . CP (cerebral palsy) [facility](HCC)     Patient Active Problem List   Diagnosis Date Noted  . 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 ANKLE FUSION    . LEG SURGERY    . TRACHEOSTOMY      Home Medications    Prior to Admission medications   Medication Sig Start Date End Date Taking? Authorizing Provider  omeprazole (PRILOSEC) 20 MG capsule Take 1 capsule (20 mg total) by mouth 2 (two) times daily before a meal. 07/03/17   Larell Baney, Jilda RocheNicholas Paul, DO    Family History Family History  Problem Relation Age of Onset  . Hypertension Mother     Social History Social History  Substance Use Topics  . Smoking status: Never Smoker  . Smokeless tobacco: Never Used  . Alcohol use No     Allergies   Shellfish allergy   Review of Systems Review of Systems  Gastrointestinal: Positive for abdominal pain. Negative for constipation and diarrhea.     Physical Exam Triage Vital Signs ED Triage Vitals [07/03/17 1353]  Enc Vitals Group     BP 122/70     Pulse Rate 78     Resp 18     Temp 98.6 F (37 C)     Temp Source Oral   Updated Vital Signs BP 122/70 (BP Location: Right Arm)   Pulse 78   Temp  98.6 F (37 C) (Oral)   Resp 18   LMP 06/20/2017   Physical Exam  Constitutional: She appears well-developed and well-nourished.  HENT:  Mouth/Throat: Oropharynx is clear and moist.  Eyes: EOM are normal. Pupils are equal, round, and reactive to light.  Cardiovascular: Normal rate and regular rhythm.   No murmur heard. Pulmonary/Chest: Effort normal and breath sounds normal. No respiratory distress.  Abdominal: Soft. Bowel sounds are normal. She exhibits no distension and no mass. There is no guarding.  TTP in epigastric region  Skin: Skin is warm and dry.  Psychiatric: She has a normal mood and affect. Judgment normal.     UC Treatments / Results  Procedures Procedures none  Initial Impression / Assessment and Plan / UC Course  I have reviewed the triage vital signs and the nursing notes.  Pertinent labs & imaging results that were available during my care of the patient were reviewed by me and considered in my medical decision making (see chart for details).   Patient presents for reflux. She is out of the medicine that was helpful. I will refill the medicine and strongly encouraged her to set up with a primary care physician in the area so  they may manage this in the future. She is to be discharged in stable condition. The patient voiced understanding and agreement to the plan.  Final Clinical Impressions(s) / UC Diagnoses   Final diagnoses:  Gastroesophageal reflux disease, esophagitis presence not specified    New Prescriptions New Prescriptions   OMEPRAZOLE (PRILOSEC) 20 MG CAPSULE    Take 1 capsule (20 mg total) by mouth 2 (two) times daily before a meal.     Sharlene Dory, DO 07/03/17 1436

## 2017-07-03 NOTE — ED Triage Notes (Signed)
Pt  Reports  abd  Pain        X   1  Month    No  Other  Symptoms   Pt  Eating    Candy   In  Intake

## 2017-07-03 NOTE — Discharge Instructions (Signed)
Call around to get a primary care provider in the area so they can manage issues like this in the future.  Mind which foods cause your issue to be worse. Losing weight and elevating the head of the bed have been shown to be helpful.

## 2017-08-31 ENCOUNTER — Other Ambulatory Visit: Payer: Self-pay | Admitting: Family Medicine

## 2017-10-04 ENCOUNTER — Other Ambulatory Visit: Payer: Self-pay | Admitting: Nurse Practitioner

## 2017-10-04 DIAGNOSIS — Z1231 Encounter for screening mammogram for malignant neoplasm of breast: Secondary | ICD-10-CM

## 2017-11-14 ENCOUNTER — Ambulatory Visit: Payer: Medicare Other

## 2017-12-28 DIAGNOSIS — A5901 Trichomonal vulvovaginitis: Secondary | ICD-10-CM | POA: Insufficient documentation

## 2018-01-05 ENCOUNTER — Encounter (INDEPENDENT_AMBULATORY_CARE_PROVIDER_SITE_OTHER): Payer: Self-pay | Admitting: Family

## 2018-01-05 ENCOUNTER — Ambulatory Visit (INDEPENDENT_AMBULATORY_CARE_PROVIDER_SITE_OTHER): Payer: Medicare Other | Admitting: Family

## 2018-01-05 ENCOUNTER — Ambulatory Visit (INDEPENDENT_AMBULATORY_CARE_PROVIDER_SITE_OTHER): Payer: Self-pay

## 2018-01-05 DIAGNOSIS — M62830 Muscle spasm of back: Secondary | ICD-10-CM | POA: Diagnosis not present

## 2018-01-05 DIAGNOSIS — M545 Low back pain, unspecified: Secondary | ICD-10-CM

## 2018-01-05 MED ORDER — METHOCARBAMOL 500 MG PO TABS: 500.0000 mg | ORAL_TABLET | Freq: Three times a day (TID) | ORAL | 0 refills | Status: DC

## 2018-01-05 NOTE — Progress Notes (Signed)
   Office Visit Note   Patient: Amber Juarez           Date of Birth: 06/26/1976           MRN: [id number] Visit Date: 01/05/2018              Requested by: Lenoria ChimeWhite, Valerie A, FNP [address] [place], [place] [id number] PCP: Lenoria ChimeWhite, Valerie A, FNP  No chief complaint on file.     HPI: The patient is a 42 year old woman seen today for evaluation of low back pain and spasm. This has been ongoing for about 4 days. Has not ever had anything like this in past. Worse lying down. Describes as spasm and cramping pain in right low back. No injury. No relieving factors. No numbness, tingling or shooting pain. No radicular symptoms.   Assessment & Plan: Visit Diagnoses:  1. Lumbar pain   2. Back spasm     Plan: advised heating pad. Provided rx for robaxin. May use ibu. Follow up if no relief in next few weeks.   Follow-Up Instructions: Return in about 4 weeks (around 02/02/2018), or if symptoms worsen or fail to improve.   Back Exam   Tenderness  Back tenderness location: paraspinal tenderness on right.  Range of Motion  The patient has normal back ROM.  Muscle Strength  The patient has normal back strength.  Tests  Straight leg raise right: negative Straight leg raise left: negative      Patient is alert, oriented, no adenopathy, well-dressed, normal affect, normal respiratory effort.   Imaging: No results found. No images are attached to the encounter.  Labs: Lab Results  Component Value Date   REPTSTATUS 06/06/2016 FINAL 06/04/2016   CULT >=100,000 COLONIES/mL ESCHERICHIA COLI (A) 06/04/2016   LABORGA ESCHERICHIA COLI (A) 06/04/2016    @LABSALLVALUES (HGBA1)@  There is no height or weight on file to calculate BMI.  Orders:  Orders Placed This Encounter  Procedures  . XR Lumbar Spine 2-3 Views   No orders of the defined types were placed in this encounter.    Procedures: No procedures performed  Clinical Data: No additional  findings.  ROS:  All other systems negative, except as noted in the HPI. Review of Systems  Constitutional: Negative for chills and fever.  Musculoskeletal: Positive for back pain and myalgias.  Neurological: Negative for weakness and numbness.    Objective: Vital Signs: There were no vitals taken for this visit.  Specialty Comments:  No specialty comments available.  PMFS History: Patient Active Problem List   Diagnosis Date Noted  . 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 Arthritis   . CP (cerebral palsy) (HCC)     Family History  Problem Relation Age of Onset  . Hypertension Mother     Past Surgical History:  Procedure Laterality Date  . ANKLE FUSION    . LEG SURGERY    . TRACHEOSTOMY     Social History   Occupational History  . Not on file  Tobacco Use  . Smoking status: Never Smoker  . Smokeless tobacco: Never Used  Substance and Sexual Activity  . Alcohol use: No  . Drug use: No  . Sexual activity: Yes    Birth control/protection: None

## 2018-01-06 ENCOUNTER — Ambulatory Visit (INDEPENDENT_AMBULATORY_CARE_PROVIDER_SITE_OTHER): Payer: Medicare Other | Admitting: Family

## 2018-06-19 ENCOUNTER — Ambulatory Visit (HOSPITAL_COMMUNITY)
Admission: EM | Admit: 2018-06-19 | Discharge: 2018-06-19 | Disposition: A | Discharge status: Home / Self Care | Payer: Medicare Other | Attending: Family Medicine | Admitting: Family Medicine

## 2018-06-19 ENCOUNTER — Telehealth (HOSPITAL_COMMUNITY): Payer: Self-pay | Admitting: Emergency Medicine

## 2018-06-19 ENCOUNTER — Encounter (HOSPITAL_COMMUNITY): Payer: Self-pay | Admitting: Emergency Medicine

## 2018-06-19 DIAGNOSIS — R519 Headache, unspecified: Secondary | ICD-10-CM

## 2018-06-19 DIAGNOSIS — L02411 Cutaneous abscess of right axilla: Secondary | ICD-10-CM

## 2018-06-19 DIAGNOSIS — R51 Headache: Secondary | ICD-10-CM

## 2018-06-19 MED ORDER — IBUPROFEN 800 MG PO TABS
ORAL_TABLET | ORAL | Status: AC
  Filled 2018-06-19: qty 1

## 2018-06-19 MED ORDER — SULFAMETHOXAZOLE-TRIMETHOPRIM 800-160 MG PO TABS: 1.0000 | ORAL_TABLET | Freq: Two times a day (BID) | ORAL | 0 refills | Status: DC

## 2018-06-19 MED ORDER — IBUPROFEN 800 MG PO TABS
800.0000 mg | ORAL_TABLET | Freq: Once | ORAL | Status: AC
  Administered 2018-06-19: 800 mg via ORAL

## 2018-06-19 MED ORDER — IBUPROFEN 800 MG PO TABS: 800.0000 mg | ORAL_TABLET | Freq: Three times a day (TID) | ORAL | 0 refills | Status: DC

## 2018-06-19 MED ORDER — SULFAMETHOXAZOLE-TRIMETHOPRIM 800-160 MG PO TABS: 1.0000 | ORAL_TABLET | Freq: Two times a day (BID) | ORAL | 0 refills | Status: AC

## 2018-06-19 NOTE — ED Triage Notes (Signed)
Shoulder pain and headache that started today.

## 2018-06-19 NOTE — Discharge Instructions (Signed)
Warm compresses to further promote drainage from right underarm.  Complete course of antibiotics.   Ibuprofen as needed for pain or headache.  If worsening or no improvement in the next week of abscess please return to be seen or follow up with your primary care provider.  If develop dizziness, severe headache, vision loss or change or otherwise worsening of headache please return or go to Er.

## 2018-06-19 NOTE — ED Provider Notes (Signed)
[facility]    CSN: [id number] Arrival date & time: 06/19/18  1939     History   Chief Complaint Chief Complaint  Patient presents with  . Shoulder Pain  . Headache    HPI Amber Juarez is a 42 y.o. female.   Amber Juarez presents with complaints of "knot" to right axilla which she noticed today, tender. Has not taken any medications for symptoms. Denies any previous similar. Also states has a 10/10 headache today. Has not taken any medications for pain. No nausea oro vomiting. No light sound or movement sensitivity. Pain is sharp and all over her head. No head injury. Not taking any medications currently. Hx of arthritis as well as CP.     ROS per HPI.      Past Medical History:  Diagnosis Date  . Arthritis   . CP (cerebral palsy) [facility](HCC)     Patient Active Problem List   Diagnosis Date Noted  . 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 ANKLE FUSION    . LEG SURGERY    . TRACHEOSTOMY      OB History   None      Home Medications    Prior to Admission medications   Medication Sig Start Date End Date Taking? Authorizing Provider  ibuprofen (ADVIL,MOTRIN) 800 MG tablet Take 1 tablet (800 mg total) by mouth 3 (three) times daily. 06/19/18   Georgetta HaberBurky, Natalie B, NP  methocarbamol (ROBAXIN) 500 MG tablet Take 1 tablet (500 mg total) by mouth 3 (three) times daily. 01/05/18   Adonis HugueninZamora, Erin R, NP  omeprazole (PRILOSEC) 20 MG capsule Take 1 capsule (20 mg total) by mouth 2 (two) times daily before a meal. 07/03/17   Wendling, Jilda RocheNicholas Paul, DO  sulfamethoxazole-trimethoprim (BACTRIM DS) 800-160 MG tablet Take 1 tablet by mouth 2 (two) times daily for 7 days. 06/19/18 06/26/18  Georgetta HaberBurky, Natalie B, NP    Family History Family History  Problem Relation Age of Onset  . Hypertension Mother     Social History Social History   Tobacco Use  . Smoking status: Never Smoker  . Smokeless tobacco: Never  Used  Substance Use Topics  . Alcohol use: No  . Drug use: No     Allergies   Shellfish allergy   Review of Systems Review of Systems   Physical Exam Triage Vital Signs ED Triage Vitals [06/19/18 1959]  Enc Vitals Group     BP 125/73     Pulse Rate 86     Resp 16     Temp 98.7 F (37.1 C)     Temp Source Oral     SpO2 100 %     Weight 138 lb (62.6 kg)     Height      Head Circumference      Peak Flow      Pain Score 10     Pain Loc      Pain Edu?      Excl. in GC?    No data found.  Updated Vital Signs BP 125/73   Pulse 86   Temp 98.7 F (37.1 C) (Oral)   Resp 16   Wt 138 lb (62.6 kg)   LMP 06/15/2018   SpO2 100%   BMI 25.24 kg/m    Physical Exam  Constitutional: She is oriented to person, place, and time. She appears well-developed and well-nourished. No distress.  HENT:  Head: Normocephalic and  atraumatic.  Eyes: Pupils are equal, round, and reactive to light. EOM are normal.  Cardiovascular: Normal rate, regular rhythm and normal heart sounds.  Pulmonary/Chest: Effort normal and breath sounds normal.  Neurological: She is alert and oriented to person, place, and time. She has normal strength. She is not disoriented. No cranial nerve deficit or sensory deficit. GCS eye subscore is 4. GCS verbal subscore is 5. GCS motor subscore is 6.  Strength and activity at baseline for patient   Skin: Skin is warm and dry.  Right axilla with small firm abscess which is open, no active drainage; no fluctuance or induration   Psychiatric: She has a normal mood and affect.     UC Treatments / Results  Labs (all labs ordered are listed, but only abnormal results are displayed) Labs Reviewed - No data to display  EKG None  Radiology No results found.  Procedures Procedures (including critical care time)  Medications Ordered in UC Medications  ibuprofen (ADVIL,MOTRIN) tablet 800 mg (has no administration in time range)    Initial Impression /  Assessment and Plan / UC Course  I have reviewed the triage vital signs and the nursing notes.  Pertinent labs & imaging results that were available during my care of the patient were reviewed by me and considered in my medical decision making (see chart for details).     Without acute neurological findings on exam. Ibuprofen for headache. Without indication for incision for drainage to axilla, draining, no fluctuance. Course of bactrim. Return precautions provided. Patient verbalized understanding and agreeable to plan.    Final Clinical Impressions(s) / UC Diagnoses   Final diagnoses:  Abscess of axilla, right  Acute nonintractable headache, unspecified headache type     Discharge Instructions     Warm compresses to further promote drainage from right underarm.  Complete course of antibiotics.   Ibuprofen as needed for pain or headache.  If worsening or no improvement in the next week of abscess please return to be seen or follow up with your primary care provider.  If develop dizziness, severe headache, vision loss or change or otherwise worsening of headache please return or go to Er.    ED Prescriptions    Medication Sig Dispense Auth. Provider   sulfamethoxazole-trimethoprim (BACTRIM DS) 800-160 MG tablet Take 1 tablet by mouth 2 (two) times daily for 7 days. 14 tablet Linus Mako B, NP   ibuprofen (ADVIL,MOTRIN) 800 MG tablet Take 1 tablet (800 mg total) by mouth 3 (three) times daily. 21 tablet Georgetta Haber, NP     Controlled Substance Prescriptions [place] Controlled Substance Registry consulted? Not Applicable   Georgetta Haber, NP 06/19/18 2051

## 2018-09-10 ENCOUNTER — Encounter (HOSPITAL_COMMUNITY): Payer: Self-pay

## 2018-09-10 ENCOUNTER — Ambulatory Visit (HOSPITAL_COMMUNITY)
Admission: EM | Admit: 2018-09-10 | Discharge: 2018-09-10 | Disposition: A | Discharge status: Home / Self Care | Payer: Medicare Other | Attending: Internal Medicine | Admitting: Internal Medicine

## 2018-09-10 DIAGNOSIS — R1030 Lower abdominal pain, unspecified: Secondary | ICD-10-CM | POA: Diagnosis not present

## 2018-09-10 DIAGNOSIS — B372 Candidiasis of skin and nail: Secondary | ICD-10-CM

## 2018-09-10 DIAGNOSIS — K5900 Constipation, unspecified: Secondary | ICD-10-CM | POA: Diagnosis not present

## 2018-09-10 DIAGNOSIS — Z3202 Encounter for pregnancy test, result negative: Secondary | ICD-10-CM | POA: Diagnosis not present

## 2018-09-10 LAB — POCT URINALYSIS DIP (DEVICE)
BILIRUBIN URINE: NEGATIVE
GLUCOSE, UA: NEGATIVE mg/dL
Hgb urine dipstick: NEGATIVE
KETONES UR: NEGATIVE mg/dL
LEUKOCYTES UA: NEGATIVE
Nitrite: NEGATIVE
Protein, ur: NEGATIVE mg/dL
SPECIFIC GRAVITY, URINE: 1.02 (ref 1.005–1.030)
UROBILINOGEN UA: 4 mg/dL — AB (ref 0.0–1.0)
pH: 6.5 (ref 5.0–8.0)

## 2018-09-10 LAB — POCT PREGNANCY, URINE: Preg Test, Ur: NEGATIVE

## 2018-09-10 MED ORDER — HYOSCYAMINE SULFATE 0.125 MG SL SUBL: 0.1250 mg | SUBLINGUAL_TABLET | Freq: Four times a day (QID) | SUBLINGUAL | 0 refills | Status: DC | PRN

## 2018-09-10 MED ORDER — FLUCONAZOLE 150 MG PO TABS: 150.0000 mg | ORAL_TABLET | Freq: Once | ORAL | 0 refills | Status: AC

## 2018-09-10 MED ORDER — NYSTATIN 100000 UNIT/GM EX POWD: Freq: Three times a day (TID) | CUTANEOUS | 0 refills | Status: DC

## 2018-09-10 MED ORDER — POLYETHYLENE GLYCOL 3350 17 G PO PACK: 17.0000 g | PACK | Freq: Two times a day (BID) | ORAL | 0 refills | Status: DC

## 2018-09-10 NOTE — Discharge Instructions (Addendum)
Recommend start Miralax (generic)- use a capful in a drink twice a day as directed. May take Hyoscyamine every 6 hours as needed for abdominal cramping. Start Diflucan 150mg  one tablet today, repeat in 2 days. May use Nystatin powder- apply 3 times a day to your groin area. Follow-up with your PCP in 5 to 7 days if not improving.

## 2018-09-10 NOTE — ED Triage Notes (Signed)
Stomach pain x 4 days. Pt has a rash that peeling its between her thighs. Pt thinks she maybe pregnant.

## 2018-09-11 NOTE — ED Provider Notes (Signed)
[facility]    CSN: [id number] Arrival date & time: 09/10/18  1702     History   Chief Complaint Chief Complaint  Patient presents with  . Abdominal Pain    HPI Amber Juarez is a 42 y.o. female.   41 year old female accompanied by a significant other with concern over central to upper abdominal pain for the past 4 days. Denies any fever, nausea, vomiting or diarrhea. Indicates that she feels bloated and constipated and usually has a bowel movement about once a week. She has taken OTC stool softeners and Miralax but does not take it on a regular basis. She is also concerned about a peeling rash present in her upper thighs near her groin. Occasionally itchy. Non-painful. Feels this may be due to irritation from irregular leg movement/contact due to cerebral palsy. Has been given Nizoral cream in the past with limited success. Also requests urine test for pregnancy. LMP 08/14/18 and has had unprotected sex. No dysuria, unusual vaginal discharge or pelvic pain. She has not taken any medication for symptoms. Other chronic health issues include arthritis and takes Ibuprofen prn.   The history is provided by the patient.    Past Medical History:  Diagnosis Date  . Arthritis   . CP (cerebral palsy) [facility])     Patient Active Problem List   Diagnosis Date Noted  . 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 ANKLE FUSION    . LEG SURGERY    . TRACHEOSTOMY      OB History   None      Home Medications    Prior to Admission medications   Medication Sig Start Date End Date Taking? Authorizing Provider  hyoscyamine (LEVSIN SL) 0.125 MG SL tablet Place 1 tablet (0.125 mg total) under the tongue every 6 (six) hours as needed for cramping. 09/10/18   Sudie Grumbling, NP  ibuprofen (ADVIL,MOTRIN) 800 MG tablet Take 1 tablet (800 mg total) by mouth 3 (three) times daily. 06/19/18   Georgetta Haber, NP    nystatin (NYSTATIN) powder Apply topically 3 (three) times daily. To affected area as needed 09/10/18   Sudie Grumbling, NP  polyethylene glycol (MIRALAX / GLYCOLAX) packet Take 17 g by mouth 2 (two) times daily. 09/10/18   Sudie Grumbling, NP    Family History Family History  Problem Relation Age of Onset  . Hypertension Mother     Social History Social History   Tobacco Use  . Smoking status: Never Smoker  . Smokeless tobacco: Never Used  Substance Use Topics  . Alcohol use: No  . Drug use: No     Allergies   Shellfish allergy   Review of Systems Review of Systems  Constitutional: Negative for activity change, appetite change, chills, fatigue and fever.  HENT: Negative for congestion, mouth sores, sore throat and trouble swallowing.   Respiratory: Negative for cough, chest tightness, shortness of breath and wheezing.   Gastrointestinal: Positive for abdominal pain and constipation. Negative for anal bleeding, blood in stool, diarrhea, nausea and vomiting.  Genitourinary: Negative for decreased urine volume, difficulty urinating, dysuria, flank pain, frequency, genital sores, hematuria, pelvic pain, urgency, vaginal bleeding, vaginal discharge and vaginal pain.  Musculoskeletal: Positive for arthralgias, gait problem and myalgias. Negative for back pain, neck pain and neck stiffness.  Skin: Positive for color change and rash. Negative for wound.  Allergic/Immunologic: Negative for immunocompromised state.  Neurological:  Negative for dizziness, tremors, seizures, syncope, weakness, light-headedness, numbness and headaches.  Hematological: Negative for adenopathy. Does not bruise/bleed easily.  Psychiatric/Behavioral: The patient is nervous/anxious.      Physical Exam Triage Vital Signs ED Triage Vitals  Enc Vitals Group     BP 09/10/18 1731 131/86     Pulse Rate 09/10/18 1731 77     Resp 09/10/18 1731 18     Temp 09/10/18 1731 98.1 F (36.7 C)     Temp src --       SpO2 09/10/18 1731 98 %     Weight 09/10/18 1732 151 lb (68.5 kg)     Height --      Head Circumference --      Peak Flow --      Pain Score --      Pain Loc --      Pain Edu? --      Excl. in GC? --    No data found.  Updated Vital Signs BP 131/86   Pulse 77   Temp 98.1 F (36.7 C)   Resp 18   Wt 151 lb (68.5 kg)   LMP 08/14/2018   SpO2 98%   BMI 27.62 kg/m   Visual Acuity Right Eye Distance:   Left Eye Distance:   Bilateral Distance:    Right Eye Near:   Left Eye Near:    Bilateral Near:     Physical Exam  Constitutional: She is oriented to person, place, and time. Vital signs are normal. She appears well-developed and well-nourished. She is cooperative. She does not appear ill. No distress.  Patient sitting on exam table in no acute distress.   HENT:  Head: Normocephalic and atraumatic.  Eyes: Conjunctivae and EOM are normal.  Neck: Normal range of motion. Neck supple.  Cardiovascular: Normal rate, regular rhythm and normal heart sounds.  No murmur heard. Pulmonary/Chest: Effort normal and breath sounds normal. No respiratory distress. She has no decreased breath sounds. She has no wheezes. She has no rhonchi. She has no rales.  Abdominal: Soft. Normal appearance and bowel sounds are normal. She exhibits no distension, no fluid wave and no mass. There is no hepatosplenomegaly. There is generalized tenderness and tenderness in the epigastric area. There is no rigidity, no rebound, no guarding and no CVA tenderness. No hernia. Hernia confirmed negative in the right inguinal area and confirmed negative in the left inguinal area.  Genitourinary:    Pelvic exam was performed with patient supine. There is no rash on the right labia. There is no rash on the left labia.  Genitourinary Comments: Darker brown discoloration of skin in an oval pattern bilaterally on upper thighs/groin area. No distinct rash or lesions seen on labia. Edges are peeling with slight redness and  shiny appearance. Non-tender. No distinct satellite lesions. No discharge.   Lymphadenopathy: No inguinal adenopathy noted on the right or left side.  Neurological: She is alert and oriented to person, place, and time. She exhibits abnormal muscle tone (right leg).  Skin: Skin is warm and dry. Capillary refill takes less than 2 seconds. Rash noted.  Psychiatric: Her behavior is normal. Thought content normal. Her mood appears anxious. Her speech is rapid and/or pressured. Cognition and memory are normal.  Vitals reviewed.    UC Treatments / Results  Labs (all labs ordered are listed, but only abnormal results are displayed) Labs Reviewed  POCT URINALYSIS DIP (DEVICE) - Abnormal; Notable for the following components:  Result Value   Urobilinogen, UA 4.0 (*)    All other components within normal limits  POCT PREGNANCY, URINE    EKG None  Radiology No results found.  Procedures Procedures (including critical care time)  Medications Ordered in UC Medications - No data to display  Initial Impression / Assessment and Plan / UC Course  I have reviewed the triage vital signs and the nursing notes.  Pertinent labs & imaging results that were available during my care of the patient were reviewed by me and considered in my medical decision making (see chart for details).     Reviewed negative urine pregnancy test with patient. Reviewed urinalysis results- positive for Urobilinogen but otherwise unremarkable. Discussed that urobilinogen may be increased in urine when constipated. Discussed that she does not appear to have a UTI or other urinary disorder. Urine culture not performed.  Reviewed that we will trial medication for constipation and pain. May take Miralax 1 capful twice a day until regular bowel movement occurs and then continue at least once to twice a day. May take Hyoscyamine every 6 hours as needed for abdominal cramping.  Reviewed that rash on inner thighs/groin, most  likely yeast. Will trial Nystatin powder 3 times a day to affected area. May take Diflucan 150mg  once today and repeat 1 in 2 days. May need additional anti-fungal medications and evaluation if not resolving.  Encouraged to use condoms with each and every sexual encounter. Discussed that if abdominal pain does not improve, will screen for STD's. Recommend continue to monitor symptoms- if pain gets more severe, go to the ER. Otherwise, follow-up with her PCP in 5 to 7 days if not improving.  Final Clinical Impressions(s) / UC Diagnoses   Final diagnoses:  Lower abdominal pain, unspecified  Constipation, unspecified constipation type  Candidal intertrigo     Discharge Instructions     Recommend start Miralax (generic)- use a capful in a drink twice a day as directed. May take Hyoscyamine every 6 hours as needed for abdominal cramping. Start Diflucan 150mg  one tablet today, repeat in 2 days. May use Nystatin powder- apply 3 times a day to your groin area. Follow-up with your PCP in 5 to 7 days if not improving.     ED Prescriptions    Medication Sig Dispense Auth. Provider   nystatin (NYSTATIN) powder Apply topically 3 (three) times daily. To affected area as needed 60 g Sudie Grumbling, NP   fluconazole (DIFLUCAN) 150 MG tablet Take 1 tablet (150 mg total) by mouth once for 1 dose. May repeat 1 tablet in 2 days 2 tablet Laaibah Wartman, Ali Lowe, NP   hyoscyamine (LEVSIN SL) 0.125 MG SL tablet Place 1 tablet (0.125 mg total) under the tongue every 6 (six) hours as needed for cramping. 30 tablet Willett Lefeber, Ali Lowe, NP   polyethylene glycol (MIRALAX / GLYCOLAX) packet Take 17 g by mouth 2 (two) times daily. 100 each Sudie Grumbling, NP     Controlled Substance Prescriptions [place] Controlled Substance Registry consulted? Not Applicable   Sudie Grumbling, NP 09/11/18 1339

## 2018-09-27 ENCOUNTER — Other Ambulatory Visit: Payer: Self-pay

## 2018-09-27 ENCOUNTER — Encounter (HOSPITAL_COMMUNITY): Payer: Self-pay | Admitting: Emergency Medicine

## 2018-09-27 ENCOUNTER — Ambulatory Visit (HOSPITAL_COMMUNITY)
Admission: EM | Admit: 2018-09-27 | Discharge: 2018-09-27 | Disposition: A | Discharge status: Home / Self Care | Payer: Medicare Other | Attending: Family Medicine | Admitting: Family Medicine

## 2018-09-27 DIAGNOSIS — N3090 Cystitis, unspecified without hematuria: Secondary | ICD-10-CM | POA: Diagnosis present

## 2018-09-27 DIAGNOSIS — Z3202 Encounter for pregnancy test, result negative: Secondary | ICD-10-CM

## 2018-09-27 LAB — POCT PREGNANCY, URINE: PREG TEST UR: NEGATIVE

## 2018-09-27 LAB — POCT URINALYSIS DIP (DEVICE)
BILIRUBIN URINE: NEGATIVE
Glucose, UA: NEGATIVE mg/dL
Nitrite: NEGATIVE
PH: 8.5 — AB (ref 5.0–8.0)
Protein, ur: 100 mg/dL — AB
SPECIFIC GRAVITY, URINE: 1.015 (ref 1.005–1.030)
Urobilinogen, UA: 0.2 mg/dL (ref 0.0–1.0)

## 2018-09-27 MED ORDER — CEPHALEXIN 500 MG PO CAPS: 500.0000 mg | ORAL_CAPSULE | Freq: Two times a day (BID) | ORAL | 0 refills | Status: DC

## 2018-09-27 NOTE — ED Notes (Signed)
Bed: UC08 Expected date:  Expected time:  Means of arrival:  Comments: Patient still in room

## 2018-09-27 NOTE — ED Triage Notes (Signed)
Onset 2 days ago of symptoms.  Pain with urination.  Patient has back pain

## 2018-09-27 NOTE — ED Provider Notes (Signed)
[facility]    ASSESSMENT & PLAN:  1. Cystitis     Meds ordered this encounter  Medications  . cephALEXin (KEFLEX) 500 MG capsule    Sig: Take 1 capsule (500 mg total) by mouth 2 (two) times daily.    Dispense:  10 capsule    Refill:  0    Urine culture sent. Will notify patient when results available. Will follow up with her PCP or here if not showing improvement over the next 48 hours, sooner if needed.  Outlined signs and symptoms indicating need for more acute intervention. Patient verbalized understanding. After Visit Summary given.  SUBJECTIVE:  Amber Juarez is a 42 y.o. female who complains of urinary frequency, urgency and dysuria for the past several days. No specific aggravating or alleviating factors reported. No flank pain, fever, chills, abnormal vaginal discharge or bleeding. Hematuria: not present. Normal PO intake. No abdominal pain. No self treatment. Ambulatory without difficulty.  ROS: As in HPI.  OBJECTIVE:  Vitals:   09/27/18 1734  BP: 126/74  Pulse: 89  Resp: 18  Temp: 98.1 F (36.7 C)  TempSrc: Oral  SpO2: 98%   Appears well, in no apparent distress. Abdomen is soft without tenderness, guarding, mass, rebound or organomegaly. No CVA tenderness or inguinal adenopathy noted.  Labs Reviewed  POCT URINALYSIS DIP (DEVICE) - Abnormal; Notable for the following components:      Result Value   Ketones, ur TRACE (*)    Hgb urine dipstick LARGE (*)    pH 8.5 (*)    Protein, ur 100 (*)    Leukocytes, UA LARGE (*)    All other components within normal limits  URINE CULTURE  POCT PREGNANCY, URINE    Allergies  Allergen Reactions  . Shellfish Allergy Anaphylaxis    Past Medical History:  Diagnosis Date  . Arthritis   . CP (cerebral palsy) (HCC)    Social History   Socioeconomic History  . Marital status: Divorced    Spouse name: Not on file  . Number of children: Not on file  . Years of education: Not on file  .  Highest education level: Not on file  Occupational History  . Not on file  Social Needs  . Financial resource strain: Not on file  . Food insecurity:    Worry: Not on file    Inability: Not on file  . Transportation needs:    Medical: Not on file    Non-medical: Not on file  Tobacco Use  . Smoking status: Never Smoker  . Smokeless tobacco: Never Used  Substance and Sexual Activity  . Alcohol use: No  . Drug use: No  . Sexual activity: Yes    Birth control/protection: None  Lifestyle  . Physical activity:    Days per week: Not on file    Minutes per session: Not on file  . Stress: Not on file  Relationships  . Social connections:    Talks on phone: Not on file    Gets together: Not on file    Attends religious service: Not on file    Active member of club or organization: Not on file    Attends meetings of clubs or organizations: Not on file    Relationship status: Not on file  . Intimate partner violence:    Fear of current or ex partner: Not on file    Emotionally abused: Not on file    Physically abused: Not on file    Forced  sexual activity: Not on file  Other Topics Concern  . Not on file  Social History Narrative  . Not on file   Family History  Problem Relation Age of Onset  . Hypertension Mother        Mardella Layman, MD 09/27/18 [phone]

## 2018-09-29 LAB — URINE CULTURE: Culture: >=100000 — AB

## 2018-10-02 ENCOUNTER — Telehealth (HOSPITAL_COMMUNITY): Payer: Self-pay

## 2018-10-02 NOTE — Telephone Encounter (Signed)
Urine culture positive for Proteus Mirabilis. This was treated with Keflex. Attempted to reach patient. No answer at this time.

## 2019-02-22 ENCOUNTER — Ambulatory Visit (INDEPENDENT_AMBULATORY_CARE_PROVIDER_SITE_OTHER): Payer: Medicare Other | Admitting: Family Medicine

## 2019-02-22 ENCOUNTER — Encounter (INDEPENDENT_AMBULATORY_CARE_PROVIDER_SITE_OTHER): Payer: Self-pay | Admitting: Family Medicine

## 2019-02-22 DIAGNOSIS — M25562 Pain in left knee: Secondary | ICD-10-CM

## 2019-02-22 DIAGNOSIS — G8929 Other chronic pain: Secondary | ICD-10-CM

## 2019-02-22 MED ORDER — METHYLPREDNISOLONE ACETATE 40 MG/ML IJ SUSP: 40.0000 mg | Freq: Once | INTRAMUSCULAR | Status: DC

## 2019-02-22 NOTE — Progress Notes (Signed)
   Office Visit Note   Patient: Amber Juarez           Date of Birth: 07-13-1976           MRN: [id number] Visit Date: 02/22/2019 Requested by: Lenoria Chime, FNP [address] [place], [place] [address] PCP: Lenoria Chime, FNP  Subjective: Chief Complaint  Patient presents with  . Left Knee - Pain    Chronic knee pain - "I have OA due to my cerebral palsy." Has had cortisone injections in the past (IM) and once in the joint.    HPI: She is here with left knee pain.  Longstanding intermittent problem with her knees due to osteoarthritis.  She has had cortisone injections in the past with good results and she feels like she needs one again.              ROS: She has cerebral palsy.  All other systems were negative.  Objective: Vital Signs: There were no vitals taken for this visit.  Physical Exam:  Left knee: 1+ effusion with no warmth or erythema.  Limited flexion, mild tenderness on the medial joint line.  Imaging: None today.  Assessment & Plan: 1.  Left knee pain with history of arthritis per patient report -Discussed with patient, elected to inject again with cortisone.  If symptoms persist we will obtain x-rays.     Procedures: Left knee steroid injection: After sterile prep with alcohol, injected 3 cc 1% lidocaine without epinephrine and 40 mg methylprednisolone from superolateral approach, a flash of clear yellow synovial fluid was obtained prior to injection.   PMFS History: Patient Active Problem List   Diagnosis Date Noted  . Trichomonas vaginitis 12/28/2017  . 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 Bilateral ankle pain 07/30/2014  . Acquired pes planus of right foot 06/10/2014  . Acquired deformity of ankle and foot 10/01/2013  . Other acquired deformities of unspecified foot 10/01/2013  . Hallux valgus with bunions 07/27/2013  . Other specified dermatoses 07/27/2013  . Pes planus 07/27/2013  . Primary osteoarthritis, unspecified  ankle and foot 07/27/2013  . Sebaceous cyst 07/27/2013  . Skin disorder 07/27/2013  . Vulvar discomfort 07/27/2013  . 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 Paresthesias 07/06/2012  . Skin sensation disturbance 07/06/2012   Past Medical History:  Diagnosis Date  . Arthritis   . CP (cerebral palsy) (HCC)     Family History  Problem Relation Age of Onset  . Hypertension Mother     Past Surgical History:  Procedure Laterality Date  . ANKLE FUSION    . LEG SURGERY    . TRACHEOSTOMY     Social History   Occupational History  . Not on file  Tobacco Use  . Smoking status: Never Smoker  . Smokeless tobacco: Never Used  Substance and Sexual Activity  . Alcohol use: No  . Drug use: No  . Sexual activity: Yes    Birth control/protection: None

## 2019-03-02 ENCOUNTER — Encounter (INDEPENDENT_AMBULATORY_CARE_PROVIDER_SITE_OTHER): Payer: Self-pay | Admitting: Family Medicine

## 2019-03-02 ENCOUNTER — Ambulatory Visit (INDEPENDENT_AMBULATORY_CARE_PROVIDER_SITE_OTHER): Payer: Self-pay

## 2019-03-02 ENCOUNTER — Ambulatory Visit (INDEPENDENT_AMBULATORY_CARE_PROVIDER_SITE_OTHER): Payer: Medicare Other | Admitting: Family Medicine

## 2019-03-02 DIAGNOSIS — G8929 Other chronic pain: Secondary | ICD-10-CM | POA: Diagnosis not present

## 2019-03-02 DIAGNOSIS — M25562 Pain in left knee: Secondary | ICD-10-CM

## 2019-03-02 MED ORDER — NABUMETONE 750 MG PO TABS: 750.0000 mg | ORAL_TABLET | Freq: Two times a day (BID) | ORAL | 6 refills | Status: DC | PRN

## 2019-03-02 NOTE — Progress Notes (Signed)
   Office Visit Note   Patient: Amber Juarez           Date of Birth: 09-15-1976           MRN: [id number] Visit Date: 03/02/2019 Requested by: Lenoria Chime, FNP [address] [place], [place] [address] PCP: Lenoria Chime, FNP  Subjective: Chief Complaint  Patient presents with  . Left Knee - Pain, Follow-up    Knee gives way    HPI: She is here with persistent left knee pain.  Injection unfortunately did not help.  Now she feels like her knee is intermittently unstable.  She has not fallen, denies any locking or catching symptoms.               ROS: Noncontributory  Objective: Vital Signs: There were no vitals taken for this visit.  Physical Exam:  Left knee: 1+ effusion, no warmth or erythema.  Lockman's feels solid, PCL feels solid.  No significant laxity with varus/valgus stress.  Mild tenderness medial and lateral joint line, no palpable click with McMurray's.  Imaging: X-rays left knee: Mild tibiofemoral and moderate patellofemoral degenerative changes.  No sign of loose body.    Assessment & Plan: 1.  Left knee pain with instability symptoms, etiology uncertain.  Could be related to knee effusion.  Cannot rule out meniscal pathology. -Trial of Relafen.  Hinged knee brace for support.  If symptoms persist she will call and we will order MRI scan.     Procedures: No procedures performed  No notes on file     PMFS History: Patient Active Problem List   Diagnosis Date Noted  . Trichomonas vaginitis 12/28/2017  . 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 Bilateral ankle pain 07/30/2014  . Acquired pes planus of right foot 06/10/2014  . Acquired deformity of ankle and foot 10/01/2013  . Other acquired deformities of unspecified foot 10/01/2013  . Hallux valgus with bunions 07/27/2013  . Other specified dermatoses 07/27/2013  . Pes planus 07/27/2013  . Primary osteoarthritis, unspecified ankle and foot 07/27/2013  . Sebaceous cyst  07/27/2013  . Skin disorder 07/27/2013  . Vulvar discomfort 07/27/2013  . 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 Paresthesias 07/06/2012  . Skin sensation disturbance 07/06/2012   Past Medical History:  Diagnosis Date  . Arthritis   . CP (cerebral palsy) (HCC)     Family History  Problem Relation Age of Onset  . Hypertension Mother     Past Surgical History:  Procedure Laterality Date  . ANKLE FUSION    . LEG SURGERY    . TRACHEOSTOMY     Social History   Occupational History  . Not on file  Tobacco Use  . Smoking status: Never Smoker  . Smokeless tobacco: Never Used  Substance and Sexual Activity  . Alcohol use: No  . Drug use: No  . Sexual activity: Yes    Birth control/protection: None

## 2019-03-09 ENCOUNTER — Encounter (INDEPENDENT_AMBULATORY_CARE_PROVIDER_SITE_OTHER): Payer: Self-pay | Admitting: Family Medicine

## 2019-03-09 ENCOUNTER — Ambulatory Visit (INDEPENDENT_AMBULATORY_CARE_PROVIDER_SITE_OTHER): Payer: Medicare Other | Admitting: Family Medicine

## 2019-03-09 ENCOUNTER — Other Ambulatory Visit: Payer: Self-pay

## 2019-03-09 DIAGNOSIS — M25562 Pain in left knee: Secondary | ICD-10-CM | POA: Diagnosis not present

## 2019-03-09 MED ORDER — DICLOFENAC SODIUM 75 MG PO TBEC: 75.0000 mg | DELAYED_RELEASE_TABLET | Freq: Two times a day (BID) | ORAL | 3 refills | Status: DC | PRN

## 2019-03-09 NOTE — Progress Notes (Signed)
   Office Visit Note   Patient: Amber Juarez           Date of Birth: Sep 13, 1976           MRN: [id number] Visit Date: 03/09/2019 Requested by: Lenoria Chime, FNP [address] [place], [place] [address] PCP: Lenoria Chime, FNP  Subjective: Chief Complaint  Patient presents with  . Left Knee - Pain, Follow-up    HPI: She is here for follow-up left knee pain.  No improvement with Relafen, knee brace did not stay on properly.  She is frustrated by her ongoing pain, getting ready to leave to [facility] in a couple weeks.  She wants to know what is going on with her knee.              ROS:  all other systems were reviewed and are negative.  Objective: Vital Signs: There were no vitals taken for this visit.  Physical Exam:  General:  Alert and oriented, in no acute distress. Pulm:  Breathing unlabored. Psy:  Normal mood, congruent affect. Skin: No visible rash. Left knee: 1-2+ effusion with no warmth or erythema.  Ligaments feel stable.  Tender on the medial and lateral joint lines.  Imaging: None today.  Assessment & Plan: 1.  Persistent left knee pain with effusion, question degenerative meniscus tear. -Discussed with patient and elected to proceed with MRI scan.  Depending on results, could consult with surgeon or possibly try 1 more injection.  Diclofenac given to take as needed.     Procedures: No procedures performed  No notes on file     PMFS History: Patient Active Problem List   Diagnosis Date Noted  . Trichomonas vaginitis 12/28/2017  . 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 Bilateral ankle pain 07/30/2014  . Acquired pes planus of right foot 06/10/2014  . Acquired deformity of ankle and foot 10/01/2013  . Other acquired deformities of unspecified foot 10/01/2013  . Hallux valgus with bunions 07/27/2013  . Other specified dermatoses 07/27/2013  . Pes planus 07/27/2013  . Primary osteoarthritis, unspecified ankle and foot 07/27/2013   . Sebaceous cyst 07/27/2013  . Skin disorder 07/27/2013  . Vulvar discomfort 07/27/2013  . 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 Paresthesias 07/06/2012  . Skin sensation disturbance 07/06/2012   Past Medical History:  Diagnosis Date  . Arthritis   . CP (cerebral palsy) (HCC)     Family History  Problem Relation Age of Onset  . Hypertension Mother     Past Surgical History:  Procedure Laterality Date  . ANKLE FUSION    . LEG SURGERY    . TRACHEOSTOMY     Social History   Occupational History  . Not on file  Tobacco Use  . Smoking status: Never Smoker  . Smokeless tobacco: Never Used  Substance and Sexual Activity  . Alcohol use: No  . Drug use: No  . Sexual activity: Yes    Birth control/protection: None

## 2019-03-13 ENCOUNTER — Other Ambulatory Visit: Payer: Self-pay | Admitting: Family Medicine

## 2019-03-13 DIAGNOSIS — Z1231 Encounter for screening mammogram for malignant neoplasm of breast: Secondary | ICD-10-CM

## 2019-03-15 ENCOUNTER — Other Ambulatory Visit: Payer: Self-pay

## 2019-03-15 ENCOUNTER — Ambulatory Visit
Admission: RE | Admit: 2019-03-15 | Discharge: 2019-03-15 | Disposition: A | Discharge status: Home / Self Care | Payer: Medicare Other | Source: Ambulatory Visit | Attending: Family Medicine | Admitting: Family Medicine

## 2019-03-15 DIAGNOSIS — M25562 Pain in left knee: Secondary | ICD-10-CM

## 2019-03-16 ENCOUNTER — Ambulatory Visit (INDEPENDENT_AMBULATORY_CARE_PROVIDER_SITE_OTHER): Payer: Medicare Other | Admitting: Family Medicine

## 2019-03-16 ENCOUNTER — Telehealth (INDEPENDENT_AMBULATORY_CARE_PROVIDER_SITE_OTHER): Payer: Self-pay

## 2019-03-16 ENCOUNTER — Encounter (INDEPENDENT_AMBULATORY_CARE_PROVIDER_SITE_OTHER): Payer: Self-pay | Admitting: Family Medicine

## 2019-03-16 DIAGNOSIS — G8929 Other chronic pain: Secondary | ICD-10-CM

## 2019-03-16 DIAGNOSIS — M25562 Pain in left knee: Secondary | ICD-10-CM

## 2019-03-16 DIAGNOSIS — M1712 Unilateral primary osteoarthritis, left knee: Secondary | ICD-10-CM

## 2019-03-16 DIAGNOSIS — M1711 Unilateral primary osteoarthritis, right knee: Secondary | ICD-10-CM

## 2019-03-16 MED ORDER — VITAMIN D3 25 MCG (1000 UNIT) PO TABS: 1000.0000 [IU] | ORAL_TABLET | Freq: Every day | ORAL | 3 refills | Status: DC

## 2019-03-16 MED ORDER — MELOXICAM 15 MG PO TABS: 15.0000 mg | ORAL_TABLET | Freq: Every day | ORAL | 6 refills | Status: DC | PRN

## 2019-03-16 NOTE — Progress Notes (Signed)
   Office Visit Note   Patient: Amber Juarez           Date of Birth: 1976-08-04           MRN: [id number] Visit Date: 03/16/2019 Requested by: Lenoria Chime, FNP [address] [place], [place] [address] PCP: Lenoria Chime, FNP  Subjective: Chief Complaint  Patient presents with  . Left Knee - Pain, Follow-up    S/p MRI    HPI: She is here to discuss left knee MRI results.  Both knees are hurting but the left knee hurts more.              ROS: No fevers or chills.  All other systems were reviewed and are negative.  Objective: Vital Signs: There were no vitals taken for this visit.  Physical Exam:  General:  Alert and oriented, in no acute distress. Pulm:  Breathing unlabored. Psy:  Normal mood, congruent affect. Skin: No rash on her skin. Knees: Range of motion unchanged, trace effusion bilaterally.  Imaging: MRI left knee reviewed on computer shows no sign of meniscus tear.  There is arthritic change in the patellofemoral joint.  Ligaments look intact.  Assessment & Plan: 1.  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  We will get approval for both knees.  She will follow-up afterward for injections.     Procedures: No procedures performed  No notes on file     PMFS History: Patient Active Problem List   Diagnosis Date Noted  . Trichomonas vaginitis 12/28/2017  . 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 Bilateral ankle pain 07/30/2014  . Acquired pes planus of right foot 06/10/2014  . Acquired deformity of ankle and foot 10/01/2013  . Other acquired deformities of unspecified foot 10/01/2013  . Hallux valgus with bunions 07/27/2013  . Other specified dermatoses 07/27/2013  . Pes planus 07/27/2013  . Primary osteoarthritis, unspecified ankle and foot 07/27/2013  . Sebaceous cyst 07/27/2013  . Skin disorder  07/27/2013  . Vulvar discomfort 07/27/2013  . 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 Paresthesias 07/06/2012  . Skin sensation disturbance 07/06/2012   Past Medical History:  Diagnosis Date  . Arthritis   . CP (cerebral palsy) (HCC)     Family History  Problem Relation Age of Onset  . Hypertension Mother     Past Surgical History:  Procedure Laterality Date  . ANKLE FUSION    . LEG SURGERY    . TRACHEOSTOMY     Social History   Occupational History  . Not on file  Tobacco Use  . Smoking status: Never Smoker  . Smokeless tobacco: Never Used  Substance and Sexual Activity  . Alcohol use: No  . Drug use: No  . Sexual activity: Yes    Birth control/protection: None

## 2019-03-16 NOTE — Telephone Encounter (Signed)
-----   Message from Lavada Mesi, MD sent at 03/16/2019 10:28 AM EDT ----- Regarding: Gel injection Please request approval for bilateral gel injections for knee arthritis.

## 2019-03-16 NOTE — Telephone Encounter (Signed)
Noted.  Will submit.  

## 2019-03-19 ENCOUNTER — Telehealth (INDEPENDENT_AMBULATORY_CARE_PROVIDER_SITE_OTHER): Payer: Self-pay

## 2019-03-19 NOTE — Telephone Encounter (Signed)
Submitted VOB for SynviscOne, bilateral knee. 

## 2019-03-20 ENCOUNTER — Telehealth (INDEPENDENT_AMBULATORY_CARE_PROVIDER_SITE_OTHER): Payer: Self-pay

## 2019-03-20 NOTE — Telephone Encounter (Signed)
Talked with patient and advised her that she is approved for gel injection.  Approved for SynviscOne, bilateral knee. Buy & Bill Patient will be responsible for 20% OOP. No Co-pay No PA required  Appt. 03/21/2019

## 2019-03-21 ENCOUNTER — Other Ambulatory Visit: Payer: Self-pay

## 2019-03-21 ENCOUNTER — Ambulatory Visit (INDEPENDENT_AMBULATORY_CARE_PROVIDER_SITE_OTHER): Payer: Medicare Other | Admitting: Family Medicine

## 2019-03-21 ENCOUNTER — Encounter (INDEPENDENT_AMBULATORY_CARE_PROVIDER_SITE_OTHER): Payer: Self-pay | Admitting: Family Medicine

## 2019-03-21 DIAGNOSIS — M170 Bilateral primary osteoarthritis of knee: Secondary | ICD-10-CM | POA: Diagnosis not present

## 2019-03-21 DIAGNOSIS — M1712 Unilateral primary osteoarthritis, left knee: Secondary | ICD-10-CM

## 2019-03-21 DIAGNOSIS — M1711 Unilateral primary osteoarthritis, right knee: Secondary | ICD-10-CM

## 2019-03-21 NOTE — Progress Notes (Signed)
Subjective: She is here for bilateral Synvisc 1 injections for knee osteoarthritis.  Objective: Trace effusion in both knees with no warmth or erythema.  Procedure: Ultrasound-guided bilateral knee injections: After sterile prep with Betadine, injected 5 cc 1% lidocaine without epinephrine and Synvisc-1 using ultrasound to guide needle placement into the lateral joint recess of both knees.  Injectate was seen filling the joint capsule.  Images were documented.  Follow-up as needed.

## 2019-04-17 ENCOUNTER — Ambulatory Visit: Payer: Medicare Other

## 2019-05-30 ENCOUNTER — Telehealth: Payer: Self-pay | Admitting: Family Medicine

## 2019-05-30 ENCOUNTER — Encounter: Payer: Self-pay | Admitting: Family Medicine

## 2019-05-30 DIAGNOSIS — G809 Cerebral palsy, unspecified: Secondary | ICD-10-CM | POA: Insufficient documentation

## 2019-05-30 DIAGNOSIS — M170 Bilateral primary osteoarthritis of knee: Secondary | ICD-10-CM | POA: Insufficient documentation

## 2019-05-30 MED ORDER — IBUPROFEN 800 MG PO TABS: 800.0000 mg | ORAL_TABLET | Freq: Three times a day (TID) | ORAL | 3 refills | Status: DC | PRN

## 2019-05-30 NOTE — Telephone Encounter (Signed)
Rx written for scooter.  Try to go without whenever possible.  Ibuprofen Rx sent.

## 2019-05-30 NOTE — Telephone Encounter (Signed)
Please advise 

## 2019-05-30 NOTE — Telephone Encounter (Signed)
Pt came into the office wanting to know if you can give her a prescription for a riding scooter and also refill her medication for ibuprofen 800 MG?  [phone] [facility]  [address].

## 2019-05-31 NOTE — Telephone Encounter (Signed)
I advised the patient the Rx is ready for pickup. I also provided the name of a local provider of these, both sale and rental [facility] [employer]).  She was instructed also to try to go without the scooter whenever possible.

## 2019-06-05 ENCOUNTER — Ambulatory Visit: Payer: Medicare Other

## 2019-06-11 ENCOUNTER — Ambulatory Visit
Admission: RE | Admit: 2019-06-11 | Discharge: 2019-06-11 | Disposition: A | Discharge status: Home / Self Care | Payer: Medicare Other | Source: Ambulatory Visit | Attending: Family Medicine | Admitting: Family Medicine

## 2019-06-11 ENCOUNTER — Other Ambulatory Visit: Payer: Self-pay

## 2019-06-11 DIAGNOSIS — Z1231 Encounter for screening mammogram for malignant neoplasm of breast: Secondary | ICD-10-CM

## 2019-07-09 ENCOUNTER — Telehealth: Payer: Self-pay | Admitting: Family Medicine

## 2019-07-09 MED ORDER — DICLOFENAC SODIUM 75 MG PO TBEC: 75.0000 mg | DELAYED_RELEASE_TABLET | Freq: Two times a day (BID) | ORAL | 3 refills | Status: DC | PRN

## 2019-07-09 NOTE — Telephone Encounter (Signed)
Please advise 

## 2019-07-09 NOTE — Telephone Encounter (Signed)
Sent!

## 2019-07-09 NOTE — Telephone Encounter (Signed)
Patient came in office wanting to get a refill on her diclofenac (VOLTAREN) 75 MG EC tablet. pharmacy of choice is [facility] [address]. please advise

## 2019-07-09 NOTE — Telephone Encounter (Signed)
I called the patient: she was asking for diflucan for a yeast infection, not diclofenac. Can she have this sent to [facility]? Also, she would like an Rx for a shower chair.

## 2019-07-10 MED ORDER — FLUCONAZOLE 150 MG PO TABS: 150.0000 mg | ORAL_TABLET | ORAL | 0 refills | Status: DC

## 2019-07-10 NOTE — Telephone Encounter (Signed)
The patient is now asking for something for acid reflux. She has problems with this off & on over the years, but does not remember what she used to take. This started again about 3 days ago. She has not tried anything OTC for this yet. Please advise.

## 2019-07-10 NOTE — Telephone Encounter (Signed)
I advised the patient of the Rx for the Diflucan being sent in (see earlier part of this message strand).  I also advised her of the Pepcid recommendation - wrote this down for her. She also asked about possibly having some bloodwork done to check to see if her blood sugar is better. She had bloodwork done at her previous doctor a couple months ago, but did not receive any results. I advised her to call them to get those results. She will sign a medical records release for those records when she comes in to pick up her handicap placard application and Rx for shower chair.

## 2019-07-10 NOTE — Addendum Note (Signed)
Addended by: Hortencia Pilar on: 07/10/2019 08:01 AM   Modules accepted: Orders

## 2019-07-10 NOTE — Telephone Encounter (Signed)
I would suggest pepcid twice daily for 5-7 days, then stop.  Not good to be on any of them long-term.

## 2019-07-10 NOTE — Telephone Encounter (Signed)
Rx sent 

## 2019-07-16 DIAGNOSIS — M41119 Juvenile idiopathic scoliosis, site unspecified: Secondary | ICD-10-CM | POA: Insufficient documentation

## 2019-07-16 DIAGNOSIS — B009 Herpesviral infection, unspecified: Secondary | ICD-10-CM | POA: Insufficient documentation

## 2019-07-16 DIAGNOSIS — R32 Unspecified urinary incontinence: Secondary | ICD-10-CM | POA: Insufficient documentation

## 2019-07-27 ENCOUNTER — Telehealth: Payer: Self-pay

## 2019-07-27 NOTE — Telephone Encounter (Signed)
I called the patient to let her know we have received her records from her previous doctor. Her CBG and Hgb A1C were normal.

## 2019-09-11 ENCOUNTER — Ambulatory Visit (INDEPENDENT_AMBULATORY_CARE_PROVIDER_SITE_OTHER): Payer: Medicare Other | Admitting: Family Medicine

## 2019-09-11 ENCOUNTER — Encounter: Payer: Self-pay | Admitting: Family Medicine

## 2019-09-11 ENCOUNTER — Ambulatory Visit: Payer: Self-pay

## 2019-09-11 DIAGNOSIS — M25571 Pain in right ankle and joints of right foot: Secondary | ICD-10-CM

## 2019-09-11 DIAGNOSIS — M19071 Primary osteoarthritis, right ankle and foot: Secondary | ICD-10-CM | POA: Diagnosis not present

## 2019-09-11 MED ORDER — CELECOXIB 200 MG PO CAPS: 200.0000 mg | ORAL_CAPSULE | Freq: Two times a day (BID) | ORAL | 3 refills | Status: DC | PRN

## 2019-09-11 NOTE — Progress Notes (Signed)
I saw and examined the patient with Dr. Mayer Masker and agree with assessment and plan as outlined.  Right anterior ankle pain with degenerative changes on x-ray.  Leaving for [facility] in 2 weeks.  We will try Celebrex.  If not improving, she will come in for injection a few days prior to her trip.

## 2019-09-11 NOTE — Progress Notes (Signed)
Amber Juarez - 43 y.o. female MRN [id number]  Date of birth: 03/03/1976  Office Visit Note: Visit Date: 09/11/2019 PCP: Lavada MesiHilts, Michael, MD Referred by: Lavada MesiHilts, Michael, MD  Subjective: Chief Complaint  Patient presents with  . Right Ankle - Pain    Pain in the anterior aspect of ankle since walking in [employer] on 09/08/19. No specific incident or injury. Wants to make sure ankle is ok before [place] [facility] [place].   HPI: Amber Juarez is a 43 y.o. female with cerebral palsy who comes in today with right ankle pain for 3 days. She reports that she had sudden onset of [place]. Since that time, she has had pain with walking. No notable swelling. Has been taking ibuprofen but it is now causing stomach irritation. She is going on a trip in 2 weeks and would like to have ankle feeling better by then.   ROS Otherwise per HPI.  Assessment & Plan: Visit Diagnoses:  1. Pain in right ankle and joints of right foot   2. Primary osteoarthritis, right ankle and foot    Right ankle with osteoarthritis with joint space narrowing. Will trial celebrex as it has less gastric irritation. Will have patient return on 8/28 for corticosteroid injection 3 days prior to trip to maximize benefit.   Meds & Orders:  Meds ordered this encounter  Medications  . celecoxib (CELEBREX) 200 MG capsule    Sig: Take 1 capsule (200 mg total) by mouth 2 (two) times daily as needed.    Dispense:  60 capsule    Refill:  3    Orders Placed This Encounter  Procedures  . XR Ankle Complete Right    Follow-up: Return in about 13 days (around 09/24/2019).   Procedures: No procedures performed  No notes on file   Clinical History: No specialty comments available.   She reports that she has never smoked. She has never used smokeless tobacco. No results for input(s): HGBA1C, LABURIC in the last 8760 hours.  Objective:  VS:  HT:    WT:   BMI:     BP:   HR: bpm   TEMP: ( )  RESP:  Physical Exam  PHYSICAL EXAM: Gen: NAD, alert, cooperative with exam, well-appearing HEENT: clear conjunctiva,  CV:  no edema, capillary refill brisk, normal rate Resp: non-labored Skin: no rashes, normal turgor  Neuro: no gross deficits.  Psych:  alert and oriented  Ortho Exam  Right Ankle: - Inspection: ankle with severe pronation, external rotation of foot - Palpation: No TTP at MT heads, no TTP at base of 5th MT, no TTP over cuboid, no tenderness over navicular prominence, no TTP over lateral or medial malleolus - Strength: foot does not move but is able to resist all directions - ROM: limited ROM in foot- hardly moves in any direction - Neuro/vasc: NV intact - Special Tests: Negative anterior drawer, talar tilt   Left ankle also has very limited ROM, no TTP  Gait: feet externally rotated with severe pronation of ankles, legs swinging laterally  Imaging: Right ankle x-rays showing arthritis  Past Medical/Family/Surgical/Social History: Medications & Allergies reviewed per EMR, new medications updated. Patient Active Problem List   Diagnosis Date Noted  . Bilateral primary osteoarthritis of knee 05/30/2019  . Cerebral palsy (HCC) 05/30/2019  . Trichomonas vaginitis 12/28/2017  . 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 Bilateral ankle pain 07/30/2014  . Acquired pes planus  of right foot 06/10/2014  . Acquired deformity of ankle and foot 10/01/2013  . Other acquired deformities of unspecified foot 10/01/2013  . Hallux valgus with bunions 07/27/2013  . Other specified dermatoses 07/27/2013  . Pes planus 07/27/2013  . Primary osteoarthritis, unspecified ankle and foot 07/27/2013  . Sebaceous cyst 07/27/2013  . Skin disorder 07/27/2013  . Vulvar discomfort 07/27/2013  . 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 Paresthesias 07/06/2012  . Skin sensation disturbance 07/06/2012   Past Medical History:  Diagnosis  Date  . Arthritis   . CP (cerebral palsy) (HCC)    Family History  Problem Relation Age of Onset  . Hypertension Mother    Past Surgical History:  Procedure Laterality Date  . ANKLE FUSION    . LEG SURGERY    . TRACHEOSTOMY     Social History   Occupational History  . Not on file  Tobacco Use  . Smoking status: Never Smoker  . Smokeless tobacco: Never Used  Substance and Sexual Activity  . Alcohol use: No  . Drug use: No  . Sexual activity: Yes    Birth control/protection: None

## 2019-09-25 ENCOUNTER — Ambulatory Visit (INDEPENDENT_AMBULATORY_CARE_PROVIDER_SITE_OTHER): Payer: Medicare Other | Admitting: Family Medicine

## 2019-09-25 ENCOUNTER — Encounter: Payer: Self-pay | Admitting: Family Medicine

## 2019-09-25 DIAGNOSIS — M25571 Pain in right ankle and joints of right foot: Secondary | ICD-10-CM | POA: Diagnosis not present

## 2019-09-25 DIAGNOSIS — R350 Frequency of micturition: Secondary | ICD-10-CM | POA: Diagnosis not present

## 2019-09-25 MED ORDER — SULFAMETHOXAZOLE-TRIMETHOPRIM 800-160 MG PO TABS: 1.0000 | ORAL_TABLET | Freq: Two times a day (BID) | ORAL | 0 refills | Status: DC

## 2019-09-25 NOTE — Progress Notes (Signed)
I saw and examined the patient with Dr. Mayer Masker and agree with assessment and plan as outlined.    Still having right ankle pain and would like injection prior to [place] trip.  Given today from anterolateral approach.  Also having cystitis symptoms.  Will call in Bactrim.

## 2019-09-25 NOTE — Progress Notes (Signed)
Amber Juarez - 43 y.o. female MRN [id number]  Date of birth: 1976-07-21  Office Visit Note: Visit Date: 09/25/2019 PCP: Eunice Blase, MD Referred by: Eunice Blase, MD  Subjective: Chief Complaint  Patient presents with  . Right Ankle - Pain, Follow-up    Would like to have the cortisone injection. Her [place] trip is at the end of this week.  Marland Kitchen abdominal pain - over weekend, better today   HPI: Amber Juarez is a 43 y.o. female who comes in today for follow up of right ankle pain. She was seen on 9/15, given celebrex for ankle pain associated with osteoarthritis. She has minimal relief with this medication and is requesting cortisone injection today as she is leaving for [facility] at the end of this week.   She also reports abdominal pain and some urinary frequency. She is concerned that she has a UTI.   Otherwise per HPI.  Assessment & Plan: Visit Diagnoses:  1. Pain in right ankle and joints of right foot   2. Urinary frequency     Plan:  - corticosteroid injection of right ankle, lateral approach - antibiotics for possible UTI if symptoms persist in the next several days  Meds & Orders:  Meds ordered this encounter  Medications  . sulfamethoxazole-trimethoprim (BACTRIM DS) 800-160 MG tablet    Sig: Take 1 tablet by mouth 2 (two) times daily.    Dispense:  14 tablet    Refill:  0   No orders of the defined types were placed in this encounter.   Follow-up: PRN  Procedures: Right ankle: Lateral ankle joint palpated, prepped with betadine and alcohol, injected 5 cc lidocaine and 40 mg methylpred. Patient tolerated procedure well.   Clinical History: No specialty comments available.   She reports that she has never smoked. She has never used smokeless tobacco. No results for input(s): HGBA1C, LABURIC in the last 8760 hours.  Objective:  VS:  HT:    WT:   BMI:     BP:   HR: bpm  TEMP: ( )  RESP:  Physical Exam  PHYSICAL EXAM: Gen: NAD, alert,  cooperative with exam, well-appearing HEENT: clear conjunctiva,  CV:  no edema, capillary refill brisk, normal rate Resp: non-labored Skin: no rashes, normal turgor  Neuro: no gross deficits.  Psych:  alert and oriented  Ortho Exam   Right Ankle: - Inspection: ankle with severe pronation, external rotation of foot - Palpation: no TTP - ROM: limited ROM in foot- hardly moves in any direction - Neuro/vasc: NV intact - Special Tests: Negative anterior drawer, talar tilt   Left ankle also has very limited ROM, no TTP  Imaging: No results found.  Past Medical/Family/Surgical/Social History: Medications & Allergies reviewed per EMR, new medications updated. Patient Active Problem List   Diagnosis Date Noted  . HSV (herpes simplex virus) infection 07/16/2019  . Incontinence 07/16/2019  . 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 Bilateral primary osteoarthritis of knee 05/30/2019  . Cerebral palsy ([place]) 05/30/2019  . Trichomonas vaginitis 12/28/2017  . 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 Bilateral ankle pain 07/30/2014  . Acquired pes planus of right foot 06/10/2014  . Acquired deformity of ankle and foot 10/01/2013  . Other acquired deformities of unspecified foot 10/01/2013  . Hallux valgus with bunions 07/27/2013  . Other specified dermatoses 07/27/2013  . Pes planus 07/27/2013  . Primary osteoarthritis, unspecified ankle and foot 07/27/2013  . Sebaceous cyst 07/27/2013  . Skin disorder 07/27/2013  .  Vulvar discomfort 07/27/2013  . 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 Paresthesias 07/06/2012  . Skin sensation disturbance 07/06/2012   Past Medical History:  Diagnosis Date  . Arthritis   . CP (cerebral palsy) (HCC)    Family History  Problem Relation Age of Onset  . Hypertension Mother    Past Surgical History:  Procedure Laterality Date  . ANKLE FUSION    . LEG SURGERY    . TRACHEOSTOMY     Social History    Occupational History  . Not on file  Tobacco Use  . Smoking status: Never Smoker  . Smokeless tobacco: Never Used  Substance and Sexual Activity  . Alcohol use: No  . Drug use: No  . Sexual activity: Yes    Birth control/protection: None

## 2020-02-29 ENCOUNTER — Encounter (HOSPITAL_COMMUNITY): Payer: Self-pay | Admitting: Emergency Medicine

## 2020-02-29 ENCOUNTER — Other Ambulatory Visit: Payer: Self-pay

## 2020-02-29 ENCOUNTER — Other Ambulatory Visit: Payer: Self-pay | Admitting: Family Medicine

## 2020-02-29 ENCOUNTER — Telehealth: Payer: Self-pay | Admitting: Family Medicine

## 2020-02-29 ENCOUNTER — Emergency Department (HOSPITAL_COMMUNITY)
Admission: EM | Admit: 2020-02-29 | Discharge: 2020-02-29 | Disposition: A | Discharge status: Home / Self Care | Payer: Medicare Other | Attending: Emergency Medicine | Admitting: Emergency Medicine

## 2020-02-29 DIAGNOSIS — R350 Frequency of micturition: Secondary | ICD-10-CM | POA: Diagnosis present

## 2020-02-29 DIAGNOSIS — Z5321 Procedure and treatment not carried out due to patient leaving prior to being seen by health care provider: Secondary | ICD-10-CM | POA: Insufficient documentation

## 2020-02-29 DIAGNOSIS — R3912 Poor urinary stream: Secondary | ICD-10-CM | POA: Diagnosis not present

## 2020-02-29 LAB — POC URINE PREG, ED: Preg Test, Ur: NEGATIVE

## 2020-02-29 NOTE — ED Triage Notes (Signed)
Pt to ED with c/o UTI   Pt st's she is having urinary frequency with small amounts.  Onset 3 hours ago

## 2020-02-29 NOTE — Telephone Encounter (Signed)
I called and advised the patient the Bactrim DS has been sent in to her pharmacy. Per Dr. Prince Rome, if she is still having symptoms after she is finished with the antibiotics, she will need to come in for a recheck of the urine and have a culture done. The patient voiced understanding.

## 2020-02-29 NOTE — Telephone Encounter (Signed)
Please advise 

## 2020-02-29 NOTE — ED Notes (Signed)
Pt stated she will LWBS, advised of risks

## 2020-02-29 NOTE — Telephone Encounter (Signed)
Sent!

## 2020-02-29 NOTE — Telephone Encounter (Signed)
Patient called in regard to getting a refill of Bactrim she has a UTI. Please call her to advise.  [phone]

## 2020-03-04 ENCOUNTER — Encounter (HOSPITAL_COMMUNITY): Payer: Self-pay

## 2020-03-04 ENCOUNTER — Other Ambulatory Visit: Payer: Self-pay

## 2020-03-04 ENCOUNTER — Ambulatory Visit (HOSPITAL_COMMUNITY)
Admission: EM | Admit: 2020-03-04 | Discharge: 2020-03-04 | Disposition: A | Discharge status: Home / Self Care | Payer: Medicare Other | Attending: Physician Assistant | Admitting: Physician Assistant

## 2020-03-04 DIAGNOSIS — T7840XA Allergy, unspecified, initial encounter: Secondary | ICD-10-CM | POA: Diagnosis not present

## 2020-03-04 DIAGNOSIS — L509 Urticaria, unspecified: Secondary | ICD-10-CM

## 2020-03-04 MED ORDER — METHYLPREDNISOLONE SODIUM SUCC 125 MG IJ SOLR
80.0000 mg | Freq: Once | INTRAMUSCULAR | Status: AC
  Administered 2020-03-04: 80 mg via INTRAMUSCULAR

## 2020-03-04 MED ORDER — DIPHENHYDRAMINE HCL 25 MG PO TABS: 50.0000 mg | ORAL_TABLET | Freq: Three times a day (TID) | ORAL | 0 refills | Status: DC | PRN

## 2020-03-04 MED ORDER — PREDNISONE 10 MG (21) PO TBPK: ORAL_TABLET | ORAL | 0 refills | Status: AC

## 2020-03-04 MED ORDER — EPINEPHRINE 0.3 MG/0.3ML IJ SOAJ: 0.3000 mg | INTRAMUSCULAR | 0 refills | Status: AC | PRN

## 2020-03-04 MED ORDER — FAMOTIDINE 20 MG PO TABS: 20.0000 mg | ORAL_TABLET | Freq: Two times a day (BID) | ORAL | 0 refills | Status: DC

## 2020-03-04 MED ORDER — METHYLPREDNISOLONE SODIUM SUCC 125 MG IJ SOLR
INTRAMUSCULAR | Status: AC
  Filled 2020-03-04: qty 2

## 2020-03-04 NOTE — Discharge Instructions (Addendum)
Stop the Bactrim.  I believe that you have taken off of this medication to treat your urinary symptoms.  Take the prednisone as prescribed  Take the Pepcid twice a day for the next 3 to 4 days  I want you to take 50 mg of Benadryl when you get home.  This will make you sleepy so do not drive or operate machinery when taking this.  May take this up to every 8 hours for continued itching  If you notice worsening rash or begin to have difficulty breathing I want you to go to the emergency department or call 911.  I have also prescribed an EpiPen to be used when you have severe allergic reactions such as swelling in your throat and difficulty breathing.  If you notice any of these immediately call 911.  If your urinary tract infection symptoms return in 2 to 3 days please return for reevaluation  Please follow-up with [facility] to establish care for primary care.

## 2020-03-04 NOTE — ED Triage Notes (Signed)
Pt c/o rash after beginning abx therapy with bactrim for UTI on 02/29/20. Diffuse raised red rash noted to abdomen, BUE, BLE.  Denies SOB, facial swelling.

## 2020-03-04 NOTE — ED Provider Notes (Signed)
[facility]    CSN: [id number] Arrival date & time: 03/04/20  1515      History   Chief Complaint Chief Complaint  Patient presents with  . Dysuria  . Rash    HPI Amber Juarez is a 44 y.o. female.   Patient presents for allergic reaction rash.  She reports she woke this morning with diffuse rash difficulty breathing and mouth swelling that is since resided.  The rashes on most areas of her body and is itchy.  She currently denies any difficulty breathing or throat swelling.  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  She was recently started on Bactrim on 02/29/2020 for a suspected UTI.  She has been taking the medication since that date twice a day.  Reports this is the only new medication she has had and denies any contact with shellfish.  Patient does not have an EpiPen.  She has not taken any medications for the reaction so far.     Past Medical History:  Diagnosis Date  . Arthritis   . CP (cerebral palsy) [facility])     Patient Active Problem List   Diagnosis Date Noted  . HSV (herpes simplex virus) infection 07/16/2019  . Incontinence 07/16/2019  . 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 Bilateral primary osteoarthritis of knee 05/30/2019  . Cerebral palsy (HCC) 05/30/2019  . Trichomonas vaginitis 12/28/2017  . 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 Bilateral ankle pain 07/30/2014  . Acquired pes planus of right foot 06/10/2014  . Acquired deformity of ankle and foot 10/01/2013  . Other acquired deformities of unspecified foot 10/01/2013  . Hallux valgus with bunions 07/27/2013  . Other specified dermatoses 07/27/2013  . Pes planus 07/27/2013  . Primary osteoarthritis, unspecified ankle and foot 07/27/2013  . Sebaceous cyst 07/27/2013  . Skin disorder 07/27/2013  . Vulvar discomfort 07/27/2013  . 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 Paresthesias 07/06/2012  . Skin sensation disturbance 07/06/2012      Past Surgical History:  Procedure Laterality Date  . ANKLE FUSION    . LEG SURGERY    . TRACHEOSTOMY      OB History   No obstetric history on file.      Home Medications    Prior to Admission medications   Medication Sig Start Date End Date Taking? Authorizing Provider  sulfamethoxazole-trimethoprim (BACTRIM DS) 800-160 MG tablet TAKE 1 TABLET BY MOUTH TWICE A DAY 02/29/20  Yes Hilts, Michael, MD  celecoxib (CELEBREX) 200 MG capsule Take 1 capsule (200 mg total) by mouth 2 (two) times daily as needed. 09/11/19   Hilts, Casimiro Needle, MD  cholecalciferol (VITAMIN D) 25 MCG (1000 UT) tablet Take 1 tablet (1,000 Units total) by mouth daily. 03/16/19   Hilts, Casimiro Needle, MD  diphenhydrAMINE (BENADRYL) 25 MG tablet Take 2 tablets (50 mg total) by mouth every 8 (eight) hours as needed for itching. 03/04/20   Stark Aguinaga, Veryl Speak, PA-C  EPINEPHrine 0.3 mg/0.3 mL IJ SOAJ injection Inject 0.3 mLs (0.3 mg total) into the muscle as needed for anaphylaxis. 03/04/20   Connelly [place], Veryl Speak, PA-C  famotidine (PEPCID) 20 MG tablet Take 1 tablet (20 mg total) by mouth 2 (two) times daily. 03/04/20   Azriel Jakob, Veryl Speak, PA-C  furosemide (LASIX) 20 MG tablet  07/26/19   [provider]  ibuprofen (ADVIL) 800 MG tablet Take 1 tablet (800 mg total) by mouth 3 (three) times daily as needed. 05/30/19   Hilts, Casimiro Needle, MD  ketoconazole (NIZORAL) 2 % cream APPLY TO AFFECTED AREA EVERY DAY 07/10/19   [provider]  predniSONE (STERAPRED UNI-PAK 21 TAB) 10 MG (21) TBPK tablet Take 6 tablets (60 mg total) by mouth daily for 1 day, THEN 5 tablets (50 mg total) daily for 1 day, THEN 4 tablets (40 mg total) daily for 1 day, THEN 3 tablets (30 mg total) daily for 1 day, THEN 2 tablets (20 mg total) daily for 1 day, THEN 1 tablet (10 mg total) daily for 1 day. 03/04/20 03/10/20  Loveda Colaizzi, Marguerita Beards, PA-C  PREVIDENT 5000 BOOSTER PLUS 1.1 % PSTE  09/18/19   [provider]    Family History Family History  Problem Relation Age of  Onset  . Hypertension Mother     Social History Social History   Tobacco Use  . Smoking status: Never Smoker  . Smokeless tobacco: Never Used  Substance Use Topics  . Alcohol use: No  . Drug use: No     Allergies   Shellfish allergy   Review of Systems Review of Systems  Constitutional: Negative for chills and fever.  HENT: Positive for facial swelling. Negative for trouble swallowing.   Respiratory: Negative for cough, chest tightness and shortness of breath.   Cardiovascular: Negative for chest pain and palpitations.  Gastrointestinal: Negative for abdominal pain.  Skin: Positive for color change and rash.  Neurological: Negative for dizziness, light-headedness and headaches.     Physical Exam Triage Vital Signs ED Triage Vitals  Enc Vitals Group     BP 03/04/20 1548 117/76     Pulse Rate 03/04/20 1548 88     Resp 03/04/20 1548 18     Temp 03/04/20 1548 98.3 F (36.8 C)     Temp Source 03/04/20 1548 Oral     SpO2 03/04/20 1548 97 %     Weight --      Height --      Head Circumference --      Peak Flow --      Pain Score 03/04/20 1546 0     Pain Loc --      Pain Edu? --      Excl. in [place]? --    No data found.  Updated Vital Signs BP 117/76 (BP Location: Right Arm)   Pulse 88   Temp 98.3 F (36.8 C) (Oral)   Resp 18   LMP 02/21/2020 (Exact Date)   SpO2 97%   Visual Acuity Right Eye Distance:   Left Eye Distance:   Bilateral Distance:    Right Eye Near:   Left Eye Near:    Bilateral Near:     Physical Exam Vitals and nursing note reviewed.  Constitutional:      General: She is not in acute distress.    Appearance: Normal appearance. She is well-developed. She is not ill-appearing.  HENT:     Head: Normocephalic and atraumatic.     Comments: Mild facial swelling    Mouth/Throat:     Comments: No angioedema, tongue swelling, pharyngeal swelling.  Airway is patent Eyes:     General: No scleral icterus.    Extraocular Movements:  Extraocular movements intact.     Conjunctiva/sclera: Conjunctivae normal.     Pupils: Pupils are equal, round, and reactive to light.  Cardiovascular:     Rate and Rhythm: Normal rate and regular rhythm.     Heart sounds: No murmur.  Pulmonary:     Effort: Pulmonary effort is normal. No respiratory  distress.     Breath sounds: Normal breath sounds. No wheezing, rhonchi or rales.     Comments: Patient moving air very well on auscultation.  Fluent conversation without issue Abdominal:     Palpations: Abdomen is soft.     Tenderness: There is no abdominal tenderness.  Musculoskeletal:     Cervical back: Neck supple.     Right lower leg: No edema.     Left lower leg: No edema.  Skin:    General: Skin is warm and dry.     Comments: Urticarial rash covering severe portions of the patient's extremities and torso.  Neurological:     General: No focal deficit present.     Mental Status: She is alert and oriented to person, place, and time.      UC Treatments / Results  Labs (all labs ordered are listed, but only abnormal results are displayed) Labs Reviewed - No data to display  EKG   Radiology No results found.  Procedures Procedures (including critical care time)  Medications Ordered in UC Medications  methylPREDNISolone sodium succinate (SOLU-MEDROL) 125 mg/2 mL injection 80 mg (80 mg Intramuscular Given 03/04/20 1626)    Initial Impression / Assessment and Plan / UC Course  I have reviewed the triage vital signs and the nursing notes.  Pertinent labs & imaging results that were available during my care of the patient were reviewed by me and considered in my medical decision making (see chart for details).    #Urticaria #Allergic reaction Patient is a 44 year old female presenting with diffuse urticarial rash and apparent allergic reaction to Bactrim.  Patient completed 4 days of Bactrim therapy for parent UTI from 02/29/2020.  Patient is currently saturating at 97% not  in respiratory distress without evidence of angioedema or oropharyngeal swelling and benign chest exam.  Diffuse urticaria present.  Treated with 80 mg Solu-Medrol in clinic and prednisone taper outpatient.  Diphenhydramine instructed for 50 mg tonight and then every 8 hours as needed for continued itching and urticaria.  Pepcid prescribed for 3 to 5 days of therapy. -Patient prescribed EpiPen given known anaphylaxis to shellfish as well as diffuse urticarial rash with airway symptoms with Bactrim.  Instructed on use. -Return to patient to call 911 or report emergently to the emergency department if worsening rash, throat swelling or difficulty breathing.  Patient verbalized understanding safe for discharge. -Patient will stop Bactrim and this medication was disposed of properly in the clinic.  Final Clinical Impressions(s) / UC Diagnoses   Final diagnoses:  Urticaria  Allergic reaction, initial encounter     Discharge Instructions     Stop the Bactrim.  I believe that you have taken off of this medication to treat your urinary symptoms.  Take the prednisone as prescribed  Take the Pepcid twice a day for the next 3 to 4 days  I want you to take 50 mg of Benadryl when you get home.  This will make you sleepy so do not drive or operate machinery when taking this.  May take this up to every 8 hours for continued itching  If you notice worsening rash or begin to have difficulty breathing I want you to go to the emergency department or call 911.  I have also prescribed an EpiPen to be used when you have severe allergic reactions such as swelling in your throat and difficulty breathing.  If you notice any of these immediately call 911.  If your urinary tract infection symptoms return in 2  to 3 days please return for reevaluation  Please follow-up with [facility] to establish care for primary care.     ED Prescriptions    Medication Sig Dispense Auth. Provider    predniSONE (STERAPRED UNI-PAK 21 TAB) 10 MG (21) TBPK tablet Take 6 tablets (60 mg total) by mouth daily for 1 day, THEN 5 tablets (50 mg total) daily for 1 day, THEN 4 tablets (40 mg total) daily for 1 day, THEN 3 tablets (30 mg total) daily for 1 day, THEN 2 tablets (20 mg total) daily for 1 day, THEN 1 tablet (10 mg total) daily for 1 day. 21 tablet Kawanna Christley, Veryl Speak, PA-C   diphenhydrAMINE (BENADRYL) 25 MG tablet Take 2 tablets (50 mg total) by mouth every 8 (eight) hours as needed for itching. 30 tablet Ailynn Gow, Veryl Speak, PA-C   EPINEPHrine 0.3 mg/0.3 mL IJ SOAJ injection Inject 0.3 mLs (0.3 mg total) into the muscle as needed for anaphylaxis. 1 each Moneisha Vosler, Veryl Speak, PA-C   famotidine (PEPCID) 20 MG tablet Take 1 tablet (20 mg total) by mouth 2 (two) times daily. 30 tablet Wannetta Langland, Veryl Speak, PA-C     PDMP not reviewed this encounter.   Hermelinda Medicus, PA-C 03/04/20 1843

## 2020-03-05 ENCOUNTER — Other Ambulatory Visit: Payer: Self-pay

## 2020-03-05 ENCOUNTER — Encounter (HOSPITAL_COMMUNITY): Payer: Self-pay | Admitting: Emergency Medicine

## 2020-03-05 ENCOUNTER — Ambulatory Visit (HOSPITAL_COMMUNITY)
Admission: EM | Admit: 2020-03-05 | Discharge: 2020-03-05 | Disposition: A | Discharge status: Home / Self Care | Payer: Medicare Other

## 2020-03-05 DIAGNOSIS — L500 Allergic urticaria: Secondary | ICD-10-CM

## 2020-03-05 NOTE — Discharge Instructions (Addendum)
Take the prednisone you were prescribed

## 2020-03-05 NOTE — ED Triage Notes (Signed)
Patient is covered in hives and is scratching every where

## 2020-03-05 NOTE — ED Triage Notes (Addendum)
Patient seen yesterday.  Patient continues to have itching.  Reports itching is everywhere  AFTER REVIEWING MEDICATIONS, PATIENT ONLY PICKED UP AN EPI PEN FROM PHARMACY

## 2020-03-05 NOTE — ED Provider Notes (Signed)
[facility]    CSN: [id number] Arrival date & time: 03/05/20  0841      History   Chief Complaint Chief Complaint  Patient presents with  . Pruritis    HPI Amber Juarez is a 44 y.o. female.   Patient is a 44 year old female presents today with rash and itching.  She was seen here yesterday for allergic reaction and given Solu-Medrol injection.  She was sent home with prednisone taper and antihistamine.  Patient reporting that she is unsure if she picked up the medication or not but she hasn't taken anything.  Reporting her symptoms have not much improved. She did pick up the epi pen. She has not used this.      Past Medical History:  Diagnosis Date  . Arthritis   . CP (cerebral palsy) [facility])     Patient Active Problem List   Diagnosis Date Noted  . HSV (herpes simplex virus) infection 07/16/2019  . Incontinence 07/16/2019  . 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 Bilateral primary osteoarthritis of knee 05/30/2019  . Cerebral palsy (HCC) 05/30/2019  . Trichomonas vaginitis 12/28/2017  . 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 Bilateral ankle pain 07/30/2014  . Acquired pes planus of right foot 06/10/2014  . Acquired deformity of ankle and foot 10/01/2013  . Other acquired deformities of unspecified foot 10/01/2013  . Hallux valgus with bunions 07/27/2013  . Other specified dermatoses 07/27/2013  . Pes planus 07/27/2013  . Primary osteoarthritis, unspecified ankle and foot 07/27/2013  . Sebaceous cyst 07/27/2013  . Skin disorder 07/27/2013  . Vulvar discomfort 07/27/2013  . 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 Paresthesias 07/06/2012  . Skin sensation disturbance 07/06/2012    Past Surgical History:  Procedure Laterality Date  . ANKLE FUSION    . LEG SURGERY    . TRACHEOSTOMY      OB History   No obstetric history on file.      Home Medications    Prior to Admission medications     Medication Sig Start Date End Date Taking? Authorizing Provider  cholecalciferol (VITAMIN D) 25 MCG (1000 UT) tablet Take 1 tablet (1,000 Units total) by mouth daily. 03/16/19  Yes Hilts, Casimiro Needle, MD  diphenhydrAMINE (BENADRYL) 25 MG tablet Take 2 tablets (50 mg total) by mouth every 8 (eight) hours as needed for itching. 03/04/20  Yes Darr, Veryl Speak, PA-C  celecoxib (CELEBREX) 200 MG capsule Take 1 capsule (200 mg total) by mouth 2 (two) times daily as needed. 09/11/19   Hilts, Casimiro Needle, MD  EPINEPHrine 0.3 mg/0.3 mL IJ SOAJ injection Inject 0.3 mLs (0.3 mg total) into the muscle as needed for anaphylaxis. 03/04/20   Darr, Veryl Speak, PA-C  famotidine (PEPCID) 20 MG tablet Take 1 tablet (20 mg total) by mouth 2 (two) times daily. Patient taking differently: Take 20 mg by mouth 2 (two) times daily. HAS NOT RECEIVED FROM PHARMACY 03/04/20   Darr, Veryl Speak, PA-C  furosemide (LASIX) 20 MG tablet  07/26/19   [provider]  ibuprofen (ADVIL) 800 MG tablet Take 1 tablet (800 mg total) by mouth 3 (three) times daily as needed. 05/30/19   Hilts, Casimiro Needle, MD  ketoconazole (NIZORAL) 2 % cream APPLY TO AFFECTED AREA EVERY DAY 07/10/19   [provider]  predniSONE (STERAPRED UNI-PAK 21 TAB) 10 MG (21) TBPK tablet Take 6 tablets (60 mg total) by mouth daily for 1 day, THEN 5 tablets (50 mg total) daily for 1  day, THEN 4 tablets (40 mg total) daily for 1 day, THEN 3 tablets (30 mg total) daily for 1 day, THEN 2 tablets (20 mg total) daily for 1 day, THEN 1 tablet (10 mg total) daily for 1 day. Patient not taking: Reported on 03/05/2020 03/04/20 03/10/20  Darr, Veryl Speak, PA-C  PREVIDENT 5000 BOOSTER PLUS 1.1 % PSTE  09/18/19   [provider]    Family History Family History  Problem Relation Age of Onset  . Hypertension Mother     Social History Social History   Tobacco Use  . Smoking status: Never Smoker  . Smokeless tobacco: Never Used  Substance Use Topics  . Alcohol use: No  . Drug use: No      Allergies   Shellfish allergy and Bactrim [sulfamethoxazole-trimethoprim]   Review of Systems Review of Systems   Physical Exam Triage Vital Signs ED Triage Vitals  Enc Vitals Group     BP 03/05/20 0903 (!) 146/81     Pulse Rate 03/05/20 0903 95     Resp 03/05/20 0903 20     Temp 03/05/20 0903 (!) 97.4 F (36.3 C)     Temp Source 03/05/20 0903 Oral     SpO2 03/05/20 0903 100 %     Weight --      Height --      Head Circumference --      Peak Flow --      Pain Score 03/05/20 0858 0     Pain Loc --      Pain Edu? --      Excl. in [place]? --    No data found.  Updated Vital Signs BP (!) 146/81 (BP Location: Left Arm) Comment (BP Location): large cuff  Pulse 95   Temp (!) 97.4 F (36.3 C) (Oral)   Resp 20   LMP 02/21/2020 (Exact Date)   SpO2 100%   Visual Acuity Right Eye Distance:   Left Eye Distance:   Bilateral Distance:    Right Eye Near:   Left Eye Near:    Bilateral Near:     Physical Exam Vitals and nursing note reviewed.  Constitutional:      General: She is not in acute distress.    Appearance: Normal appearance. She is not ill-appearing, toxic-appearing or diaphoretic.  HENT:     Head: Normocephalic.     Nose: Nose normal.  Eyes:     Conjunctiva/sclera: Conjunctivae normal.  Pulmonary:     Effort: Pulmonary effort is normal.  Musculoskeletal:        General: Normal range of motion.     Cervical back: Normal range of motion.  Skin:    General: Skin is warm and dry.     Findings: No rash.     Comments: Widespread urticaria  Neurological:     Mental Status: She is alert.  Psychiatric:        Mood and Affect: Mood normal.      UC Treatments / Results  Labs (all labs ordered are listed, but only abnormal results are displayed) Labs Reviewed - No data to display  EKG   Radiology No results found.  Procedures Procedures (including critical care time)  Medications Ordered in UC Medications - No data to display  Initial  Impression / Assessment and Plan / UC Course  I have reviewed the triage vital signs and the nursing notes.  Pertinent labs & imaging results that were available during my care of the patient were reviewed  by me and considered in my medical decision making (see chart for details).     Urticaria -patient was seen here yesterday and given steroid injection.  Called [facility] and she picked up the medication yesterday.  Patient was confused what this medication was for.  Reiterated to patient that she needs to start taking prednisone that was prescribed that she has at home today to help with this rash.  She can take Benadryl for itching as needed. Follow up as needed for continued or worsening symptoms  Final Clinical Impressions(s) / UC Diagnoses   Final diagnoses:  Allergic urticaria     Discharge Instructions     Take the prednisone you were prescribed     ED Prescriptions    None     PDMP not reviewed this encounter.   Orvan July, NP 03/05/20 1216

## 2020-04-16 ENCOUNTER — Ambulatory Visit (INDEPENDENT_AMBULATORY_CARE_PROVIDER_SITE_OTHER): Payer: Medicare Other | Admitting: Family Medicine

## 2020-04-16 ENCOUNTER — Other Ambulatory Visit: Payer: Self-pay

## 2020-04-16 ENCOUNTER — Encounter: Payer: Self-pay | Admitting: Family Medicine

## 2020-04-16 DIAGNOSIS — R600 Localized edema: Secondary | ICD-10-CM | POA: Diagnosis not present

## 2020-04-16 DIAGNOSIS — M25571 Pain in right ankle and joints of right foot: Secondary | ICD-10-CM

## 2020-04-16 NOTE — Progress Notes (Signed)
   Office Visit Note   Patient: Amber Juarez           Date of Birth: 1976/02/13           MRN: [id number] Visit Date: 04/16/2020 Requested by: Lavada Mesi, MD [address] [place],  [place] [address] PCP: Lavada Mesi, MD  Subjective: Chief Complaint  Patient presents with  . Right Foot - Foot Swelling    HPI: She is here with right greater than left leg swelling.  Symptoms started about 2 months ago, no injury.  She has some pain, but main concern is the swelling.  Denies any chest pain, palpitations, shortness of breath.  No unintended weight change.  No change in her medications.              ROS:   All other systems were reviewed and are negative.  Objective: Vital Signs: There were no vitals taken for this visit.  Physical Exam:  General:  Alert and oriented, in no acute distress. Pulm:  Breathing unlabored. Psy:  Normal mood, congruent affect. Skin: She has 1-2+ edema in both legs from the foot up to mid shin.  Denna Haggard' sign is negative. CV: Regular rate and rhythm without murmurs, rubs, or gallops.   2+ radial and posterior tibial pulses. Lungs: Clear to auscultation throughout with no wheezing or areas of consolidation.    Imaging: No results found.  Assessment & Plan: 1.  Right greater than left leg edema, etiology uncertain.  Could be venous insufficiency.  Other possibilities include cardiac, renal source, DVT. -We will draw labs to further evaluate.     Procedures: No procedures performed  No notes on file     PMFS History: Patient Active Problem List   Diagnosis Date Noted  . HSV (herpes simplex virus) infection 07/16/2019  . Incontinence 07/16/2019  . 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 Bilateral primary osteoarthritis of knee 05/30/2019  . Cerebral palsy (HCC) 05/30/2019  . Trichomonas vaginitis 12/28/2017  . 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 Bilateral ankle pain 07/30/2014  . Acquired pes planus of right  foot 06/10/2014  . Acquired deformity of ankle and foot 10/01/2013  . Other acquired deformities of unspecified foot 10/01/2013  . Hallux valgus with bunions 07/27/2013  . Other specified dermatoses 07/27/2013  . Pes planus 07/27/2013  . Primary osteoarthritis, unspecified ankle and foot 07/27/2013  . Sebaceous cyst 07/27/2013  . Skin disorder 07/27/2013  . Vulvar discomfort 07/27/2013  . 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 Paresthesias 07/06/2012  . Skin sensation disturbance 07/06/2012   Past Medical History:  Diagnosis Date  . Arthritis   . CP (cerebral palsy) (HCC)     Family History  Problem Relation Age of Onset  . Hypertension Mother     Past Surgical History:  Procedure Laterality Date  . ANKLE FUSION    . LEG SURGERY    . TRACHEOSTOMY     Social History   Occupational History  . Not on file  Tobacco Use  . Smoking status: Never Smoker  . Smokeless tobacco: Never Used  Substance and Sexual Activity  . Alcohol use: No  . Drug use: No  . Sexual activity: Yes    Birth control/protection: None

## 2020-04-16 NOTE — Progress Notes (Signed)
On her way to her car after the appointment, she tripped and fell in the parking lot.  She sustained an abrasion on her face.  She did not lose consciousness.  She also had an abrasion of her left fifth finger on the dorsum of the PIP joint.  She is able to flex and extend with intact tendon function and there is no significant bony tenderness.  Her abrasions were cleaned and Bactroban applied.  She will follow-up as needed.

## 2020-04-17 ENCOUNTER — Telehealth: Payer: Self-pay | Admitting: Family Medicine

## 2020-04-17 ENCOUNTER — Ambulatory Visit (HOSPITAL_COMMUNITY)
Admission: RE | Admit: 2020-04-17 | Discharge: 2020-04-17 | Disposition: A | Discharge status: Home / Self Care | Payer: Medicare Other | Source: Ambulatory Visit | Attending: Family Medicine | Admitting: Family Medicine

## 2020-04-17 DIAGNOSIS — R600 Localized edema: Secondary | ICD-10-CM

## 2020-04-17 DIAGNOSIS — M25571 Pain in right ankle and joints of right foot: Secondary | ICD-10-CM | POA: Diagnosis not present

## 2020-04-17 LAB — CBC WITH DIFFERENTIAL/PLATELET
Absolute Monocytes: 370 cells/uL (ref 200–950)
Basophils Absolute: 50 cells/uL (ref 0–200)
Basophils Relative: 0.9 %
Eosinophils Absolute: 370 cells/uL (ref 15–500)
Eosinophils Relative: 6.6 %
HCT: 43.6 % (ref 35.0–45.0)
Hemoglobin: 14 g/dL (ref 11.7–15.5)
Lymphs Abs: 1462 cells/uL (ref 850–3900)
MCH: 25.5 pg — ABNORMAL LOW (ref 27.0–33.0)
MCHC: 32.1 g/dL (ref 32.0–36.0)
MCV: 79.3 fL — ABNORMAL LOW (ref 80.0–100.0)
MPV: 11.1 fL (ref 7.5–12.5)
Monocytes Relative: 6.6 %
Neutro Abs: 3349 cells/uL (ref 1500–7800)
Neutrophils Relative %: 59.8 %
Platelets: 274 10*3/uL (ref 140–400)
RBC: 5.5 10*6/uL — ABNORMAL HIGH (ref 3.80–5.10)
RDW: 15.6 % — ABNORMAL HIGH (ref 11.0–15.0)
Total Lymphocyte: 26.1 %
WBC: 5.6 10*3/uL (ref 3.8–10.8)

## 2020-04-17 LAB — COMPREHENSIVE METABOLIC PANEL
AG Ratio: 1.4 (calc) (ref 1.0–2.5)
ALT: 17 U/L (ref 6–29)
AST: 22 U/L (ref 10–35)
Albumin: 4.7 g/dL (ref 3.6–5.1)
Alkaline phosphatase (APISO): 88 U/L (ref 31–125)
BUN: 11 mg/dL (ref 7–25)
CO2: 16 mmol/L — ABNORMAL LOW (ref 20–32)
Calcium: 10.1 mg/dL (ref 8.6–10.2)
Chloride: 105 mmol/L (ref 98–110)
Creat: 0.76 mg/dL (ref 0.50–1.10)
Globulin: 3.3 g/dL (calc) (ref 1.9–3.7)
Glucose, Bld: 75 mg/dL (ref 65–99)
Potassium: 4 mmol/L (ref 3.5–5.3)
Sodium: 138 mmol/L (ref 135–146)
Total Bilirubin: 0.5 mg/dL (ref 0.2–1.2)
Total Protein: 8 g/dL (ref 6.1–8.1)

## 2020-04-17 LAB — URINALYSIS, ROUTINE W REFLEX MICROSCOPIC
Bilirubin Urine: NEGATIVE
Glucose, UA: NEGATIVE
Hgb urine dipstick: NEGATIVE
Ketones, ur: NEGATIVE
Leukocytes,Ua: NEGATIVE
Nitrite: NEGATIVE
Protein, ur: NEGATIVE
Specific Gravity, Urine: 1.015 (ref 1.001–1.03)
pH: 5.5 (ref 5.0–8.0)

## 2020-04-17 LAB — THYROID PANEL WITH TSH
Free Thyroxine Index: 1.8 (ref 1.4–3.8)
T3 Uptake: 25 % (ref 22–35)
T4, Total: 7.3 ug/dL (ref 5.1–11.9)
TSH: 1.77 mIU/L

## 2020-04-17 LAB — D-DIMER, QUANTITATIVE: D-Dimer, Quant: 0.8 mcg/mL FEU — ABNORMAL HIGH (ref <0.50)

## 2020-04-17 NOTE — Telephone Encounter (Signed)
D-Dimer is elevated.  Other labs look ok.  Will order bilateral LE dopplers to rule out DVT.  Notified patient.

## 2020-04-17 NOTE — Telephone Encounter (Signed)
Pt scheduled today at 11am at [facility], pt is aware of appt

## 2020-04-17 NOTE — Progress Notes (Signed)
VASCULAR LAB PRELIMINARY  PRELIMINARY  PRELIMINARY  PRELIMINARY  Bilateral lower extremity venous duplex completed.    Preliminary report:  See CV proc for preliminary results.  Left message for Barbaraann Barthel, [facility], RVT 04/17/2020, 12:51 PM

## 2020-04-18 ENCOUNTER — Telehealth: Payer: Self-pay | Admitting: Family Medicine

## 2020-04-18 DIAGNOSIS — M79604 Pain in right leg: Secondary | ICD-10-CM

## 2020-04-18 NOTE — Telephone Encounter (Signed)
Dopplers were negative for blood clots.  Will make referral to PT to try to eliminate the pain.  If still having pain after that, then we may do additional testing.

## 2020-04-18 NOTE — Telephone Encounter (Signed)
I called and left a full message on the patient's mobile voice mail of the results and plan. She already has appointments set up with OT/PT.

## 2020-04-25 ENCOUNTER — Ambulatory Visit: Payer: Medicare Other | Attending: Family Medicine

## 2020-04-25 ENCOUNTER — Telehealth: Payer: Self-pay | Admitting: Family Medicine

## 2020-04-25 ENCOUNTER — Ambulatory Visit: Payer: Medicare Other | Admitting: Occupational Therapy

## 2020-04-25 ENCOUNTER — Other Ambulatory Visit: Payer: Self-pay

## 2020-04-25 ENCOUNTER — Encounter: Payer: Self-pay | Admitting: Occupational Therapy

## 2020-04-25 DIAGNOSIS — M6281 Muscle weakness (generalized): Secondary | ICD-10-CM

## 2020-04-25 DIAGNOSIS — R2689 Other abnormalities of gait and mobility: Secondary | ICD-10-CM | POA: Diagnosis present

## 2020-04-25 DIAGNOSIS — R29898 Other symptoms and signs involving the musculoskeletal system: Secondary | ICD-10-CM | POA: Diagnosis present

## 2020-04-25 NOTE — Telephone Encounter (Signed)
Patient called requesting a call back from Terri. Patient would not state the nature of her call. Patient phone number is [phone]

## 2020-04-25 NOTE — Therapy (Signed)
[facility] [address] [place], [place], [address] Phone: [phone]   Fax:  [phone]  Occupational Therapy Evaluation  Patient Details  Name: Amber Juarez MRN: [id number] Date of Birth: 10/13/1976 Referring Provider (OT): Dr. Durenda Hurt   Encounter Date: 04/25/2020  OT End of Session - 04/25/20 1438    Visit Number  1    Number of Visits  1    Authorization Type  [employer] Dual Complete    OT Start Time  1021    OT Stop Time  1045    OT Time Calculation (min)  24 min    Activity Tolerance  Patient tolerated treatment well    Behavior During Therapy  [facility] for tasks assessed/performed       Past Medical History:  Diagnosis Date  . Arthritis   . CP (cerebral palsy) [facility])     Past Surgical History:  Procedure Laterality Date  . ANKLE FUSION    . LEG SURGERY    . TRACHEOSTOMY      There were no vitals filed for this visit.  Subjective Assessment - 04/25/20 1027    Subjective   I don't think I need OT because I've been like this my whole life (pt is agreeable to eval)    Pertinent History  Cerebral Palsy.   PMH includes:  hx of multiple falls, scoliosis, OA    Patient Stated Goals  improve feet swelling, balance    Currently in Pain?  No/denies        [facility] OT Assessment - 04/25/20 0001      Assessment   Medical Diagnosis  Cerebral Palsy    Referring Provider (OT)  Dr. Durenda Hurt    Onset Date/Surgical Date  --   approx 2 months ago, MD appt 04/17/20   Hand Dominance  Right    Prior Therapy  as a child      Precautions   Precautions  Fall      Balance Screen   Has the patient fallen in the past 6 months  Yes    How many times?  10      Home  Environment   Lives With  Daughter   36 y.o. who assists prn     Prior Function   Level of Independence  Independent with basic ADLs;Independent with household mobility without device    Leisure  playing video games      ADL   ADL comments   Pt reports performing BADLs and IADLs mod I, dtr does provide supervision for bathing/getting in tub and for heavier tasks prn and has always done so.  Does report that it is harder to lift legs to don shoes due to weight gain, but is able do so mod I      IADL   Shopping  Takes care of all shopping needs independently    Light Housekeeping  --   performs lighter tasks mod I, dtr assists for heavier tasks   Meal Prep  --   mod I per pt report   Community Mobility  Drives own vehicle      Mobility   Mobility Status  History of falls    Mobility Status Comments  pt ambulates currently without device with recent hx of incr falls, typically falls occur on stairs/outdoors--see PT eval for details      Vision - History   Additional Comments  WFL per pt report with glasses, awaiting updates to glasses  Posture/Postural Control   Posture/Postural Control  Postural limitations    Postural Limitations  Rounded Shoulders;Forward head;Flexed trunk      Coordination   9 Hole Peg Test  Right;Left    Right 9 Hole Peg Test  36.85    Left 9 Hole Peg Test  51.41      ROM / Strength   AROM / PROM / Strength  AROM      AROM   Overall AROM   Deficits    Overall AROM Comments  BUEs grossly WFL except approx 75-85% ROM L shoulder--pt reports longstanding limitations      Hand Function   Right Hand Grip (lbs)  45.6    Left Hand Grip (lbs)  34.3                      OT Education - 04/25/20 1439    Education Details  OT eval results    Person(s) Educated  Patient    Methods  Explanation    Comprehension  Verbalized understanding                 Plan - 04/25/20 1057    Clinical Impression Statement  Pt is a 44 y.o. 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  Pt with recent (last 2 months) bilateral ankle/feet swelling and frequent falls (pt report 10 in last 6 months).  Pt reports hx of occasional falls, but now more frequent.  However, pt denies new ADL/IADL or  UE changes/concerns and does not feel that she needs occupational therapy at this time.  Pt reports performing BADLs and simple cooking/cleaning tasks and driving mod with daughter assisting only for heavier tasks prn.  Pt came to appointment alone today.  Pt presents with quadraparesis, but denies changes in UE strength/coordination or ability to perform ADLs/IADLs. Therefore, no further occupational therapy needed at this time.    OT Occupational Profile and History  Detailed Assessment- Review of Records and additional review of physical, cognitive, psychosocial history related to current functional performance    Occupational performance deficits (Please refer to evaluation for details):  --    Clinical Decision Making  Limited treatment options, no task modification necessary    Comorbidities Affecting Occupational Performance:  May have comorbidities impacting occupational performance    Modification or Assistance to Complete Evaluation   No modification of tasks or assist necessary to complete eval    OT Frequency  --   eval only   Plan  no further occupational therapy at this time    Recommended Other Services  PT eval scheduled today    Consulted and Agree with Plan of Care  Patient       Patient will benefit from skilled therapeutic intervention in order to improve the following deficits and impairments:           Visit Diagnosis: Muscle weakness (generalized)  Other symptoms and signs involving the musculoskeletal system    Problem List Patient Active Problem List   Diagnosis Date Noted  . HSV (herpes simplex virus) infection 07/16/2019  . Incontinence 07/16/2019  . 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 Bilateral primary osteoarthritis of knee 05/30/2019  . Cerebral palsy ([place]) 05/30/2019  . Trichomonas vaginitis 12/28/2017  . 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 Bilateral ankle pain 07/30/2014  . Acquired pes planus of right foot 06/10/2014  .  Acquired deformity of ankle and foot 10/01/2013  . Other acquired deformities of unspecified foot 10/01/2013  .  Hallux valgus with bunions 07/27/2013  . Other specified dermatoses 07/27/2013  . Pes planus 07/27/2013  . Primary osteoarthritis, unspecified ankle and foot 07/27/2013  . Sebaceous cyst 07/27/2013  . Skin disorder 07/27/2013  . Vulvar discomfort 07/27/2013  . 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 Paresthesias 07/06/2012  . Skin sensation disturbance 07/06/2012    [facility] 04/25/2020, 2:52 PM   [facility] [address] [place], [place], [address] Phone: [phone]   Fax:  [phone]  Name: Amber Juarez MRN: [id number] Date of Birth: October 17, 1976   Willa Frater, OTR/L [facility] [address] [place], [place]  [address] [phone] phone [phone] 04/25/20 2:54 PM   Physician: Dr. Durenda Hurt  Certification Start Date: 04/25/20 Certification End Date: 05/25/20  Physician Documentation Your signature is required to indicate approval of the treatment plan as stated above.  Please sign and either send electronically or make a copy of this report for your files and return this physician signed original.  Please mark one 1.__approve of plan   2. ___approve of plan with the followingconditions. ____________________________________________________________________________________________________________________________________________   ______________________                                                       _____________________ Physician Signature                                                                     Date    Faxed to MD for signature

## 2020-04-25 NOTE — Therapy (Signed)
  Amber Juarez was interviewed today for physical therapy evaluation. During the interview process, she reported that she has been walking but it is getting very difficult for her to walk at home and in community. She is touching wall and surfing furniture while walking at home and fatigues quickly. She is interested in being evaluated for mobility scooter to improve her ability to negotiate home and community to continue to conserve energy and improve independence. Her appt will be rescheduled at later date to be evaluated for mobility scooter evaluation. She will not be charged for today's session. Pt verbalized agreement with POC.    [name] 04/25/2020, 4:14 PM  [facility] [address] [place], [place], [address] Phone: [phone]   Fax:  [phone]  Name: Amber Juarez MRN: [id number] Date of Birth: 1976-11-16

## 2020-04-28 ENCOUNTER — Telehealth: Payer: Self-pay | Admitting: Family Medicine

## 2020-04-28 DIAGNOSIS — M79604 Pain in right leg: Secondary | ICD-10-CM

## 2020-04-28 DIAGNOSIS — R600 Localized edema: Secondary | ICD-10-CM

## 2020-04-28 NOTE — Telephone Encounter (Signed)
Patient called requesting a call back from Dr. Prince Rome or nurse. Patient didn't not leave nature of her call. Patient phone number is [phone]

## 2020-04-28 NOTE — Telephone Encounter (Signed)
Echocardiogram ordered.

## 2020-04-28 NOTE — Telephone Encounter (Signed)
Please see the PT and OT notes from last week. The patient says her legs are still hurting and would like to go ahead and have additional testing done to determine cause of pain. She asked if these can be done at the hospital.  Please advise.

## 2020-04-28 NOTE — Telephone Encounter (Signed)
I was not in the office on 04/25/20, but called the patient today - see separate message from 04/28/20.

## 2020-04-28 NOTE — Telephone Encounter (Signed)
Patient still having leg pain and edema.  Will order echocardiogram.  If negative, will refer to PT.

## 2020-04-28 NOTE — Telephone Encounter (Signed)
I advised the patient of the plan - she agrees to proceed.

## 2020-05-06 ENCOUNTER — Telehealth: Payer: Self-pay | Admitting: Family Medicine

## 2020-05-06 NOTE — Telephone Encounter (Signed)
Patient would like a return call.  Patient did not specify a reason for the call.  CB#[phone].  Thank you.

## 2020-05-07 ENCOUNTER — Telehealth: Payer: Self-pay | Admitting: Family Medicine

## 2020-05-07 NOTE — Telephone Encounter (Signed)
I called the patient and advised her of this. I advised her to just continue with wearing her mask, sanitizing her hands and keeping her distance. If she returns to working, be sure any customers are doing the same. She voiced understanding.

## 2020-05-07 NOTE — Telephone Encounter (Signed)
I don't see that she has any health issues that put her at higher risk for covid.

## 2020-05-07 NOTE — Telephone Encounter (Signed)
Patient called.   Requesting a call back from Terri to discuss details of a note she needs written.   Call back: [phone]

## 2020-05-07 NOTE — Telephone Encounter (Signed)
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 She has not been able to work during this pandemic and has been receiving unemployment. The patient is requesting a letter that states she cannot work due to her health issues and the risk of worsening of her issues if she were to contract the virus.  Please advise.

## 2020-05-07 NOTE — Telephone Encounter (Signed)
I called the patient. Please see other message on this patient and her request for a letter.

## 2020-05-14 ENCOUNTER — Encounter: Payer: Self-pay | Admitting: Family Medicine

## 2020-05-14 ENCOUNTER — Encounter (HOSPITAL_COMMUNITY): Payer: Self-pay | Admitting: *Deleted

## 2020-05-14 ENCOUNTER — Other Ambulatory Visit (HOSPITAL_COMMUNITY): Payer: Medicare Other

## 2020-05-14 NOTE — Progress Notes (Signed)
CDC website states that cerebral palsy is a risk factor for more severe covid illness.  Will write a letter stating this.

## 2020-05-14 NOTE — Telephone Encounter (Signed)
I called and left a voice mail, advising the patient there is a letter ready for her to pickup.

## 2020-05-14 NOTE — Telephone Encounter (Signed)
-----   Message from Lavada Mesi, MD sent at 05/14/2020 12:58 PM EDT ----- Regarding: Letter I found on the CDC website that cerebral palsy is a risk factor for more severe covid illness.  I have written a letter to this effect.

## 2020-05-14 NOTE — Progress Notes (Signed)
Patient ID: Amber Juarez, female   DOB: 1976/02/10, 44 y.o.   MRN: [id number]   Verified with Aracely Zurita, CMA that patient had not checked in at the lobby, and is a "No Show" for echo at 4:15 pm.    Farrel Conners, RDCS

## 2020-06-04 ENCOUNTER — Other Ambulatory Visit: Payer: Self-pay

## 2020-06-04 ENCOUNTER — Ambulatory Visit (HOSPITAL_COMMUNITY): Payer: Medicare Other | Attending: Cardiology

## 2020-06-04 ENCOUNTER — Telehealth: Payer: Self-pay | Admitting: Family Medicine

## 2020-06-04 DIAGNOSIS — M79605 Pain in left leg: Secondary | ICD-10-CM | POA: Insufficient documentation

## 2020-06-04 DIAGNOSIS — M79604 Pain in right leg: Secondary | ICD-10-CM

## 2020-06-04 DIAGNOSIS — R600 Localized edema: Secondary | ICD-10-CM | POA: Insufficient documentation

## 2020-06-04 MED ORDER — PERFLUTREN LIPID MICROSPHERE
1.0000 mL | INTRAVENOUS | Status: AC | PRN
  Administered 2020-06-04: 1 mL via INTRAVENOUS

## 2020-06-04 NOTE — Telephone Encounter (Signed)
Echocardiogram looks good, no sign of heart problems.

## 2020-06-04 NOTE — Telephone Encounter (Signed)
I called and gave her the results and advised her to take the meds and wear the socks that internal medicine gave her.

## 2020-06-05 ENCOUNTER — Ambulatory Visit (HOSPITAL_COMMUNITY)
Admission: EM | Admit: 2020-06-05 | Discharge: 2020-06-05 | Disposition: A | Discharge status: Home / Self Care | Payer: Medicare Other | Attending: Urgent Care | Admitting: Urgent Care

## 2020-06-05 ENCOUNTER — Encounter (HOSPITAL_COMMUNITY): Payer: Self-pay

## 2020-06-05 DIAGNOSIS — N3001 Acute cystitis with hematuria: Secondary | ICD-10-CM

## 2020-06-05 DIAGNOSIS — Z3202 Encounter for pregnancy test, result negative: Secondary | ICD-10-CM

## 2020-06-05 DIAGNOSIS — Z79899 Other long term (current) drug therapy: Secondary | ICD-10-CM | POA: Insufficient documentation

## 2020-06-05 DIAGNOSIS — Z8249 Family history of ischemic heart disease and other diseases of the circulatory system: Secondary | ICD-10-CM | POA: Diagnosis not present

## 2020-06-05 DIAGNOSIS — Z91013 Allergy to seafood: Secondary | ICD-10-CM | POA: Insufficient documentation

## 2020-06-05 DIAGNOSIS — W19XXXA Unspecified fall, initial encounter: Secondary | ICD-10-CM | POA: Diagnosis not present

## 2020-06-05 DIAGNOSIS — R300 Dysuria: Secondary | ICD-10-CM

## 2020-06-05 DIAGNOSIS — N390 Urinary tract infection, site not specified: Secondary | ICD-10-CM

## 2020-06-05 DIAGNOSIS — G809 Cerebral palsy, unspecified: Secondary | ICD-10-CM | POA: Diagnosis not present

## 2020-06-05 DIAGNOSIS — R350 Frequency of micturition: Secondary | ICD-10-CM

## 2020-06-05 DIAGNOSIS — Z882 Allergy status to sulfonamides status: Secondary | ICD-10-CM | POA: Diagnosis not present

## 2020-06-05 LAB — POCT URINALYSIS DIP (DEVICE)
Glucose, UA: 100 mg/dL — AB
Ketones, ur: NEGATIVE mg/dL
Nitrite: NEGATIVE
Protein, ur: 100 mg/dL — AB
Specific Gravity, Urine: >=1.03 (ref 1.005–1.030)
Urobilinogen, UA: 4 mg/dL — ABNORMAL HIGH (ref 0.0–1.0)
pH: 6 (ref 5.0–8.0)

## 2020-06-05 LAB — POC URINE PREG, ED: Preg Test, Ur: NEGATIVE

## 2020-06-05 MED ORDER — CEPHALEXIN 250 MG/5ML PO SUSR: 500.0000 mg | Freq: Two times a day (BID) | ORAL | 0 refills | Status: AC

## 2020-06-05 MED ORDER — CEPHALEXIN 500 MG PO CAPS
500.0000 mg | ORAL_CAPSULE | Freq: Two times a day (BID) | ORAL | 0 refills | Status: DC
Start: 2020-06-05 — End: 2020-07-31

## 2020-06-05 NOTE — ED Provider Notes (Signed)
[facility]   MRN: [id number] DOB: 26-Apr-1976  Subjective:   Amber Juarez is a 44 y.o. female presenting for 1 day history of dysuria, hematuria, urinary frequency.  Patient admits that she does not hydrate very well with water at all, does not like to drink it.  Primary concern is about UTI.   Current Facility-Administered Medications:  .  methylPREDNISolone acetate (DEPO-MEDROL) injection 40 mg, 40 mg, Intra-articular, Once, Hilts, Michael, MD  Current Outpatient Medications:  .  celecoxib (CELEBREX) 200 MG capsule, Take 1 capsule (200 mg total) by mouth 2 (two) times daily as needed., Disp: 60 capsule, Rfl: 3 .  cholecalciferol (VITAMIN D) 25 MCG (1000 UT) tablet, Take 1 tablet (1,000 Units total) by mouth daily., Disp: 90 tablet, Rfl: 3 .  diphenhydrAMINE (BENADRYL) 25 MG tablet, Take 2 tablets (50 mg total) by mouth every 8 (eight) hours as needed for itching., Disp: 30 tablet, Rfl: 0 .  EPINEPHrine 0.3 mg/0.3 mL IJ SOAJ injection, Inject 0.3 mLs (0.3 mg total) into the muscle as needed for anaphylaxis., Disp: 1 each, Rfl: 0 .  famotidine (PEPCID) 20 MG tablet, Take 1 tablet (20 mg total) by mouth 2 (two) times daily. (Patient taking differently: Take 20 mg by mouth 2 (two) times daily. HAS NOT RECEIVED FROM PHARMACY), Disp: 30 tablet, Rfl: 0 .  furosemide (LASIX) 20 MG tablet, , Disp: , Rfl:  .  ibuprofen (ADVIL) 800 MG tablet, Take 1 tablet (800 mg total) by mouth 3 (three) times daily as needed., Disp: 90 tablet, Rfl: 3 .  ketoconazole (NIZORAL) 2 % cream, APPLY TO AFFECTED AREA EVERY DAY, Disp: , Rfl:  .  PREVIDENT 5000 BOOSTER PLUS 1.1 % PSTE, , Disp: , Rfl:    Allergies  Allergen Reactions  . Shellfish Allergy Anaphylaxis  . Bactrim [Sulfamethoxazole-Trimethoprim] Hives and Swelling    Patient had diffuse urticarial rash at visit with reported oral swelling and difficulty breathing that had resided prior to evaluation    Past Medical History:  Diagnosis Date   . Arthritis   . CP (cerebral palsy) [facility])      Past Surgical History:  Procedure Laterality Date  . ANKLE FUSION    . LEG SURGERY    . TRACHEOSTOMY      Family History  Problem Relation Age of Onset  . Hypertension Mother     Social History   Tobacco Use  . Smoking status: Never Smoker  . Smokeless tobacco: Never Used  Substance Use Topics  . Alcohol use: No  . Drug use: No    ROS   Objective:   Vitals: BP 120/81   Pulse 92   Temp 97.9 F (36.6 C) (Oral)   Resp 16   Ht 5\' 2"  (1.575 m)   Wt 175 lb (79.4 kg)   SpO2 98%   BMI 32.01 kg/m    Physical Exam Constitutional:      General: She is not in acute distress.    Appearance: Normal appearance. She is well-developed. She is not ill-appearing, toxic-appearing or diaphoretic.  HENT:     Head: Normocephalic and atraumatic.     Right Ear: External ear normal.     Left Ear: External ear normal.     Nose: Nose normal.     Comments: No trauma.    Mouth/Throat:     Mouth: Mucous membranes are moist.     Pharynx: Oropharynx is clear.  Eyes:     General: No scleral icterus.  Right eye: No discharge.        Left eye: No discharge.     Extraocular Movements: Extraocular movements intact.     Conjunctiva/sclera: Conjunctivae normal.     Pupils: Pupils are equal, round, and reactive to light.  Cardiovascular:     Rate and Rhythm: Normal rate.  Pulmonary:     Effort: Pulmonary effort is normal.  Abdominal:     General: Bowel sounds are normal. There is no distension.     Palpations: Abdomen is soft. There is no mass.     Tenderness: There is no abdominal tenderness. There is no right CVA tenderness, left CVA tenderness, guarding or rebound.  Skin:    General: Skin is warm and dry.  Neurological:     General: No focal deficit present.     Mental Status: She is alert and oriented to person, place, and time.     Cranial Nerves: No cranial nerve deficit.     Motor: No weakness.     Coordination:  Coordination abnormal (not new).     Gait: Gait abnormal (not new).  Psychiatric:        Mood and Affect: Mood normal.        Behavior: Behavior normal.        Thought Content: Thought content normal.        Judgment: Judgment normal.     Results for orders placed or performed during the hospital encounter of 06/05/20 (from the past 24 hour(s))  POCT urinalysis dip (device)     Status: Abnormal   Collection Time: 06/05/20  2:03 PM  Result Value Ref Range   Glucose, UA 100 (A) NEGATIVE mg/dL   Bilirubin Urine SMALL (A) NEGATIVE   Ketones, ur NEGATIVE NEGATIVE mg/dL   Specific Gravity, Urine >=1.030 1.005 - 1.030   Hgb urine dipstick LARGE (A) NEGATIVE   pH 6.0 5.0 - 8.0   Protein, ur 100 (A) NEGATIVE mg/dL   Urobilinogen, UA 4.0 (H) 0.0 - 1.0 mg/dL   Nitrite NEGATIVE NEGATIVE   Leukocytes,Ua SMALL (A) NEGATIVE  POC urine pregnancy     Status: None   Collection Time: 06/05/20  2:06 PM  Result Value Ref Range   Preg Test, Ur NEGATIVE NEGATIVE    Assessment and Plan :   PDMP not reviewed this encounter.  1. Dysuria   2. Urinary frequency   3. Acute cystitis with hematuria   4. Accidental fall, initial encounter     Start liquid Keflex to cover for cystitis, urine culture pending.  Recommended patient start hydrating very well with water as this may be the source of her UTI.  At discharge, patient suffered an accidental fall from stepping off the exam table onto the movable step.  Patient was reexamined and was found to be stable.  She had no complaints.  A safety zone event was filed.  Patient left the clinic amicably. Counseled patient on potential for adverse effects with medications prescribed/recommended today, ER and return-to-clinic precautions discussed, patient verbalized understanding.    Wallis Bamberg, PA-C 06/05/20 1435

## 2020-06-05 NOTE — ED Triage Notes (Signed)
PT c/o trouble urinating, hematuria started last night.

## 2020-06-06 LAB — URINE CULTURE

## 2020-06-12 DIAGNOSIS — N319 Neuromuscular dysfunction of bladder, unspecified: Secondary | ICD-10-CM | POA: Insufficient documentation

## 2020-07-03 ENCOUNTER — Other Ambulatory Visit: Payer: Self-pay | Admitting: Family Medicine

## 2020-07-03 MED ORDER — IBUPROFEN 800 MG PO TABS
800.0000 mg | ORAL_TABLET | Freq: Three times a day (TID) | ORAL | 3 refills | Status: DC | PRN
Start: 2020-07-03 — End: 2021-04-01

## 2020-07-30 ENCOUNTER — Telehealth: Payer: Self-pay | Admitting: Family Medicine

## 2020-07-30 NOTE — Telephone Encounter (Signed)
I called the patient - coming in tomorrow at 8:20, work-in appointment.

## 2020-07-30 NOTE — Telephone Encounter (Signed)
Patient called requesting a earlier appt then 8/11 for Dr. Prince Rome. Patient states she can barely bare weight on knee. Please call patient back concerning appt. Patient phone number is [phone].

## 2020-07-31 ENCOUNTER — Ambulatory Visit (INDEPENDENT_AMBULATORY_CARE_PROVIDER_SITE_OTHER): Payer: Medicare Other | Admitting: Family Medicine

## 2020-07-31 ENCOUNTER — Ambulatory Visit: Payer: Self-pay

## 2020-07-31 ENCOUNTER — Other Ambulatory Visit (INDEPENDENT_AMBULATORY_CARE_PROVIDER_SITE_OTHER): Payer: Self-pay | Admitting: Family Medicine

## 2020-07-31 ENCOUNTER — Encounter: Payer: Self-pay | Admitting: Family Medicine

## 2020-07-31 DIAGNOSIS — M25561 Pain in right knee: Secondary | ICD-10-CM

## 2020-07-31 DIAGNOSIS — G8929 Other chronic pain: Secondary | ICD-10-CM | POA: Diagnosis not present

## 2020-07-31 DIAGNOSIS — R739 Hyperglycemia, unspecified: Secondary | ICD-10-CM

## 2020-07-31 NOTE — Progress Notes (Signed)
Office Visit Note   Patient: Amber Juarez           Date of Birth: [birth year]           MRN: [id number] Visit Date: 07/31/2020 Requested by: Lavada Mesi, MD [address] [place],  [place] [address] PCP: Lavada Mesi, MD  Subjective: Chief Complaint  Patient presents with  . Right Knee - Pain    Fell on knee 3 weeks ago. Has to "walk out the pain." Took Ibuprofen after the fall - helped. Yesterday could not walk well.    HPI: She is here with right knee pain.  About 3 weeks ago she fell landing on her knee.  She was unable to walk for 3 days.  Now she can walk, but she has intermittent pain.  Yesterday it "cramped up" and she was unable to walk much of the day.  Today she is feeling somewhat better.  Her last injections were in March 2020 and she did well with Synvisc 1 up until now.               ROS:   All other systems were reviewed and are negative.  Objective: Vital Signs: There were no vitals taken for this visit.  Physical Exam:  General:  Alert and oriented, in no acute distress. Pulm:  Breathing unlabored. Psy:  Normal mood, congruent affect. Skin: No bruising Right knee: 1+ effusion with no warmth.  She has stable ligaments today.  She has some tenderness in the medial joint space and some pain with patella compression.   Imaging: XR Knee 1-2 Views Right  Result Date: 07/31/2020 X-rays of the right knee show no sign of fracture. She has mild to moderate degenerative spurring mostly in the lateral compartment and patellofemoral joint. No obvious loose body.   Assessment & Plan: 1. Status post fall with right knee contusion and aggravation of underlying DJD -Discussed options with her, elected to inject with steroid today. We will obtain approval for gel injections in case this does not give adequate relief.  2. She also reports that in the past she was told she had borderline diabetes. She is fasting this morning and would like to have labs drawn to  monitor that.     Procedures: Right knee steroid injection: After sterile prep with Betadine, injected 3 cc 1% lidocaine without epinephrine and 40 mg methylprednisolone from superolateral approach using ultrasound to guide needle placement into the joint recess.    PMFS History: Patient Active Problem List   Diagnosis Date Noted  . Neurogenic bladder 06/12/2020  . HSV (herpes simplex virus) infection 07/16/2019  . Incontinence 07/16/2019  . 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 Bilateral primary osteoarthritis of knee 05/30/2019  . Cerebral palsy ([facility]) 05/30/2019  . Trichomonas vaginitis 12/28/2017  . 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 Bilateral ankle pain 07/30/2014  . Acquired pes planus of right foot 06/10/2014  . Acquired deformity of ankle and foot 10/01/2013  . Other acquired deformities of unspecified foot 10/01/2013  . Hallux valgus with bunions 07/27/2013  . Other specified dermatoses 07/27/2013  . Pes planus 07/27/2013  . Primary osteoarthritis, unspecified ankle and foot 07/27/2013  . Sebaceous cyst 07/27/2013  . Skin disorder 07/27/2013  . Vulvar discomfort 07/27/2013  . 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 Paresthesias 07/06/2012  . Skin sensation disturbance 07/06/2012   Past Medical History:  Diagnosis Date  . Arthritis   . CP (cerebral palsy) ([facility])  Family History  Problem Relation Age of Onset  . Hypertension Mother     Past Surgical History:  Procedure Laterality Date  . ANKLE FUSION    . LEG SURGERY    . TRACHEOSTOMY     Social History   Occupational History  . Not on file  Tobacco Use  . Smoking status: Never Smoker  . Smokeless tobacco: Never Used  Substance and Sexual Activity  . Alcohol use: No  . Drug use: No  . Sexual activity: Yes    Birth control/protection: None

## 2020-08-01 LAB — BASIC METABOLIC PANEL
BUN/Creatinine Ratio: 21 (ref 9–23)
BUN: 19 mg/dL (ref 6–24)
CO2: 18 mmol/L — ABNORMAL LOW (ref 20–29)
Calcium: 9.6 mg/dL (ref 8.7–10.2)
Chloride: 106 mmol/L (ref 96–106)
Creatinine, Ser: 0.91 mg/dL (ref 0.57–1.00)
GFR calc Af Amer: 88 mL/min/{1.73_m2} (ref >59)
GFR calc non Af Amer: 76 mL/min/{1.73_m2} (ref >59)
Glucose: 112 mg/dL — ABNORMAL HIGH (ref 65–99)
Potassium: 4.4 mmol/L (ref 3.5–5.2)
Sodium: 140 mmol/L (ref 134–144)

## 2020-08-01 LAB — HGB A1C W/O EAG: Hgb A1c MFr Bld: 5.7 % — ABNORMAL HIGH (ref 4.8–5.6)

## 2020-08-04 ENCOUNTER — Telehealth: Payer: Self-pay

## 2020-08-04 NOTE — Telephone Encounter (Signed)
Called and scheduled pt for monday 

## 2020-08-04 NOTE — Telephone Encounter (Signed)
-----   Message from Garnetta Buddy, [place] sent at 07/31/2020  2:20 PM EDT ----- Regarding: FW: Gel injections . ----- Message ----- From: Lavada Mesi, MD Sent: 07/31/2020   8:59 AM EDT To: April Jackson, RMA Subject: Gel injections                                 Please request approval for right knee gel injections for OA.

## 2020-08-04 NOTE — Telephone Encounter (Signed)
Submitted for VOB for Synvisc one- right knee 

## 2020-08-04 NOTE — Telephone Encounter (Signed)
Approved for Synvisc one- Right knee Dr. Prince Rome Buy and Bill 20% OOP No copay No prior auth required

## 2020-08-05 ENCOUNTER — Telehealth: Payer: Self-pay | Admitting: Family Medicine

## 2020-08-05 NOTE — Telephone Encounter (Signed)
Glucose is 112 and hemoglobin A1c 5.7.  Both of these are in prediabetes range.

## 2020-08-06 NOTE — Telephone Encounter (Signed)
I called and advised patient of her results °

## 2020-08-11 ENCOUNTER — Ambulatory Visit (INDEPENDENT_AMBULATORY_CARE_PROVIDER_SITE_OTHER): Payer: Medicare Other | Admitting: Family Medicine

## 2020-08-11 ENCOUNTER — Encounter: Payer: Self-pay | Admitting: Family Medicine

## 2020-08-11 ENCOUNTER — Other Ambulatory Visit: Payer: Self-pay

## 2020-08-11 ENCOUNTER — Ambulatory Visit: Payer: Self-pay

## 2020-08-11 ENCOUNTER — Telehealth: Payer: Self-pay

## 2020-08-11 DIAGNOSIS — M1712 Unilateral primary osteoarthritis, left knee: Secondary | ICD-10-CM

## 2020-08-11 DIAGNOSIS — M1711 Unilateral primary osteoarthritis, right knee: Secondary | ICD-10-CM

## 2020-08-11 NOTE — Telephone Encounter (Signed)
Approved for synvisc one- left knee Dr. Prince Rome Buy and Bill  No copay 20% OOP No prior auth required

## 2020-08-11 NOTE — Progress Notes (Signed)
° °  Office Visit Note   Patient: Amber Juarez           Date of Birth: 11-Nov-1976           MRN: [id number] Visit Date: 08/11/2020 Requested by: Lavada Mesi, MD [address] [place],  [place] [address] PCP: Lavada Mesi, MD  Subjective: Chief Complaint  Patient presents with   Right Knee - Pain    HPI: She is here with left knee pain.  Last time we injected her right knee with cortisone and is feeling much better.  We had requested Synvisc 1 approval for her right knee, but now her left knee is bothering her more than the right she would like to have that injected instead.              ROS:   All other systems were reviewed and are negative.  Objective: Vital Signs: There were no vitals taken for this visit.  Physical Exam:  General:  Alert and oriented, in no acute distress. Pulm:  Breathing unlabored. Psy:  Normal mood, congruent affect. Skin: No warmth or erythema Left knee: 1+ effusion.  Tender on the medial and lateral joint lines.  Imaging: US Guided Needle Placement - No Linked Charges  Result Date: 08/11/2020 Left knee injection: After sterile prep with Betadine, injected 3 cc 1% lidocaine without epinephrine then Synvisc-1 from superolateral approach, using ultrasound to guide needle placement into the superior joint recess.  A flash of synovial fluid was obtained prior to injection confirming intra-articular placement.   Assessment & Plan: 1.  Left knee DJD -Synvisc 1 injected as above.  Follow-up as needed.  We can do the right knee later on if needed.     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   Bilateral ankle pain 07/30/2014   Acquired pes planus of right foot 06/10/2014   Acquired deformity of ankle and foot 10/01/2013   Other acquired deformities of unspecified foot 10/01/2013   Hallux valgus with bunions 07/27/2013   Other specified dermatoses 07/27/2013   Pes planus 07/27/2013   Primary osteoarthritis, unspecified ankle and foot 07/27/2013   Sebaceous cyst 07/27/2013   Skin disorder 07/27/2013   Vulvar discomfort 07/27/2013   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

## 2020-11-04 ENCOUNTER — Ambulatory Visit: Payer: Medicare Other | Admitting: Physical Therapy

## 2020-11-25 ENCOUNTER — Ambulatory Visit: Payer: Medicare Other | Attending: Internal Medicine | Admitting: Physical Therapy

## 2020-11-25 ENCOUNTER — Other Ambulatory Visit: Payer: Self-pay

## 2020-11-25 DIAGNOSIS — R2681 Unsteadiness on feet: Secondary | ICD-10-CM | POA: Insufficient documentation

## 2020-11-25 DIAGNOSIS — R29898 Other symptoms and signs involving the musculoskeletal system: Secondary | ICD-10-CM | POA: Diagnosis present

## 2020-11-25 DIAGNOSIS — R296 Repeated falls: Secondary | ICD-10-CM | POA: Insufficient documentation

## 2020-11-25 DIAGNOSIS — R2689 Other abnormalities of gait and mobility: Secondary | ICD-10-CM | POA: Diagnosis present

## 2020-11-25 DIAGNOSIS — M6281 Muscle weakness (generalized): Secondary | ICD-10-CM | POA: Insufficient documentation

## 2020-11-25 DIAGNOSIS — R293 Abnormal posture: Secondary | ICD-10-CM | POA: Diagnosis present

## 2020-11-25 NOTE — Therapy (Addendum)
[facility] [address] [place] [place], [place], [address] Phone: [phone]   Fax:  [phone]  Physical Therapy Evaluation  Patient Details  Name: Amber Juarez MRN: [id number] Date of Birth: 02-Dec-1976 Referring Provider (PT): Richrd Humbles, MD   Encounter Date: 11/25/2020   PT End of Session - 11/25/20 2057    Visit Number 1    Number of Visits 1    Date for PT Re-Evaluation 11/25/20    Authorization Type [employer] Medicare and Medicaid    PT Start Time 1230    PT Stop Time 1330    PT Time Calculation (min) 60 min    Activity Tolerance Patient tolerated treatment well    Behavior During Therapy [facility] for tasks assessed/performed           Past Medical History:  Diagnosis Date  . Arthritis   . CP (cerebral palsy) [facility])     Past Surgical History:  Procedure Laterality Date  . ANKLE FUSION    . LEG SURGERY    . TRACHEOSTOMY      There were no vitals filed for this visit.    Subjective Assessment - 11/25/20 2053    Subjective Pt referred to [facility] for evaluation for power mobility.  Pt has experienced a decline in functional mobility, is experiencing increased number of falls and greater difficulty with independent ambulation.  Pt would like to be evaluated for a power scooter    Pertinent History Quadriplegic infantile cerebral palsy, OA of knee, juvenile idiopathic scoliosisAnkle fusion, LE surgery, tracheostomy    Limitations Walking;Standing;House hold activities    Patient Stated Goals to be more independent with household and community mobility              [facility] PT Assessment - 11/25/20 2055      Assessment   Medical Diagnosis Quadriplegic CP    Referring Provider (PT) Richrd Humbles, MD    Onset Date/Surgical Date 05/28/20    Hand Dominance Right      Precautions   Precautions Other (comment)    Precaution Comments Quadriplegic infantile cerebral palsy, OA of knee, juvenile idiopathic  scoliosis, ankle fusion, LE surgery, Tracheostomy      Balance Screen   Has the patient fallen in the past 6 months Yes    How many times? >4    Has the patient had a decrease in activity level because of a fear of falling?  Yes      Prior Function   Level of Independence Needs assistance with ADLs;Needs assistance with homemaking;Needs assistance with gait              Mobility/Seating Evaluation    PATIENT INFORMATION: Name: Amber Juarez DOB: 01/22/1976  Sex: Female Date seen: 11.30.2021 Time: 12:30  Address:  [address]  [place] [address] Physician: Richrd Humbles, MD This evaluation/justification form will serve as the LMN for the following suppliers: __________________________ Supplier: [employer] Contact Person: Westley Foots, [employer] Phone:  [phone]   Seating Therapist: Misty Stanley, PT Phone:   [phone]   Phone: [phone]     Spouse/Parent/Caregiver name: N/A  Phone number: N/A Insurance/Payer: [place] Medicare primary; Medicaid [place] secondary     Reason for Referral: Power mobility  Patient/Caregiver Goals: To utilize a power scooter in her home to prevent falls when performing MRADL and to be able to go out in the community independently.  Patient was seen for face-to-face evaluation for new scooter.  Also  present was [employer] to discuss recommendations and wheelchair options.  Further paperwork was completed and sent to vendor.  Patient appears to qualify for power mobility device at this time per objective findings.   MEDICAL HISTORY: Diagnosis: Primary Diagnosis: G80.8 - Quadriplegic Infantile Cerebral Palsy Onset: 1975 Diagnosis: Limited Mobility Z74.09   []Progressive Disease Relevant past and future surgeries: None   Height: 5'2" Weight: 185 lb Explain recent changes or trends in weight: has gained about 40lb   History including Falls: 3-4 falls while ambulating and one fall off tall stool pt was using to sit on in the bathroom  for grooming; one injury - struck face with one fall.  PMH includes: Quadriplegic infantile cerebral palsy, OA of knee, juvenile idiopathic scoliosis, ankle fusion, LE surgery, tracheostomy    HOME ENVIRONMENT: []House  []Condo/town home  [x]Apartment  []Assisted Living    []Lives Alone [x] Lives with Others                                                                                          Hours with caregiver: ?????  []Home is accessible to patient           Stairs      [x]Yes [] No     Ramp []Yes [x]No Comments:  Currently, pt has to go up 4 steps to enter.  Is looking to move into a one level house with level entry - more handicap accessible.  Floors are all linoleum     COMMUNITY ADL: TRANSPORTATION: [x]Car    []Van    []Public Transportation    []Adapted w/c Lift    []Ambulance    []Other:       []Sits in wheelchair during transport  Employment/School: ????? Specific requirements pertaining to mobility ?????  Other: Drives a SUV; has a family member with her when she goes out in the community.  Is looking into getting a lift for the back of her SUV - pt wants to be able to go out in the community independently without a family member    FUNCTIONAL/SENSORY PROCESSING SKILLS:  Handedness:   [x]Right     []Left    []NA  Comments:  ?????  Functional Processing Skills for Wheeled Mobility [x]Processing Skills are adequate for safe wheelchair operation  Areas of concern than may interfere with safe operation of wheelchair Description of problem   [] Attention to environment      []Judgment      [] Hearing  [] Vision or visual processing      []Motor Planning  [] Fluctuations in Behavior  ?????    VERBAL COMMUNICATION: [x]WFL receptive [] WFL expressive [x]Understandable  []Difficult to understand  []non-communicative [] Uses an augmented communication device  CURRENT SEATING / MOBILITY: Current Mobility Base:  [x]None []Dependent []Manual []Scooter []Power  Type of Control: ?????   Manufacturer:  ?????Size:  ?????Age: ?????  Current Condition of Mobility Base:  ?????   Current Wheelchair components:  ?????  Describe posture in present seating system:  ?????      SENSATION and SKIN ISSUES: Sensation [x]Intact  []Impaired []Absent  Level of sensation: ?????  Pressure Relief: Able to perform effective pressure relief :    [x]Yes  [] No Method: ????? If not, Why?: ?????  Skin Issues/Skin Integrity Current Skin Issues  []Yes [x]No []Intact [] Red area[] Open Area  []Scar Tissue []At risk from prolonged sitting Where  ?????  History of Skin Issues  []Yes [x]No Where  ????? When  ?????  Hx of skin flap surgeries  []Yes [x]No Where  ????? When  ?????  Limited sitting tolerance []Yes [x]No Hours spent sitting in wheelchair daily: ?????  Complaint of Pain:  Please describe: low back pain   Swelling/Edema: Edema in bilat feet; managing with medication   ADL STATUS (in reference to wheelchair use):  Indep Assist Unable Indep with Equip Not assessed Comments  Dressing [] [x] [] [] [] needs assistance with lower body dressing and shoes seated edge of bed  Eating [x] [] [] [] [] ?????  Toileting [] [] [] [x] [] needs assistance with standing from a low toilet; uses grab bars that were installed  Bathing [] [] [] [x] [] uses grab bars - has had a fall getting out of tub  Grooming/Hygiene [] [] [] [x] [] has to sit on a bar stool for grooming; bar stool broke and pt fell to the ground  Meal Prep [] [] [] [x] [] uses bar stool when cooking  IADLS [] [x] [] [] [] not able to walk around large stores  Bowel Management: [x]Continent  []Incontinent  []Accidents Comments:  ?????  Bladder Management: []Continent  []Incontinent  [x]Accidents Comments:  due to diuretic medication; uses depends     WHEELCHAIR SKILLS: Manual w/c Propulsion: []UE or LE strength and endurance sufficient to participate in ADLs using manual wheelchair Arm : []left []right   []Both      Distance:  ????? Foot:  []left []right   []Both  Operate Scooter: [x] Strength, hand grip, balance and transfer appropriate for use []Living environment is accessible for use of scooter  Operate Power w/c:  [] Std. Joystick   [] Alternative Controls Indep [] Assist [] Dependent/unable [] N/A [x]  []Safe          [] Functional      Distance: ?????  Bed confined without wheelchair [] Yes [x] No   STRENGTH/RANGE OF MOTION:  AROM Range of Motion Strength  Shoulder >90 deg but not able to maintain shoulder flexion against resistance 3/5; pt loses balance when therapist applies resistance  Elbow WFL 4-/5 bilateral  Wrist/Hand WFL 4-/5 bilateral  Hip Limited extension 2/5 bilateral  Knee Limited extension 3-/5 bilateral  Ankle Limited dorsiflexion 2/5     MOBILITY/BALANCE:  [] Patient is totally dependent for mobility  ?????    Balance Transfers Ambulation  Sitting Balance: Standing Balance: [x] Independent [] Independent/Modified Independent  [] WFL     [] [facility] [] Supervision [] Supervision  [x] Uses UE for balance  [x] Supervision [] Min Assist [x] Ambulates with Assist  25-30% hand held assistance    [] Min Assist [] Min assist [] Mod Assist [] Ambulates with Device:      [] RW  [] StW  [] Cane  [] ?????  [] Mod Assist [] Mod assist [] Max assist   [] Max Assist [] Max assist [] Dependent [] Indep. Short Distance Only  [] Unable [] Unable [] Lift / Sling Required Distance (in feet)  ?????   [] Sliding board [] Unable to Ambulate (see explanation below)  Cardio Status:  [x]Intact  [] Impaired   [] NA     ?????  Respiratory Status:  []Intact   [x]Impaired   []NA     dyspnea on exertion when ambulating short household distances  Orthotics/Prosthetics: Not currently  Comments (Address manual vs power w/c vs scooter): Chabely Norby has a mobility limitation that significantly impairs safe, timely participation in one or more mobility related ADL's.  Ayjah has a medical diagnosis of  quadriplegic cerebral palsy resulting in bilateral upper and lower extremity weakness, spasticity, spinal scoliosis, and impaired postural control.  Nolah's mobility deficit cannot be remediated with a cane or walker due to significant lower extremity weakness, spasticity and decreased range of motion that results in a crouched gait and foot drag.  Christien's gait velocity was measured with the 10-meter walk test.  Laqueta Linden ambulated 10 meters in 14.37 seconds which is a velocity of 0.69 m/sec.  Normal walking speed is 1.33 m/sec with slower velocities correlating with increased falls risk.  After ambulating the 10 meters for the assessment Nyleah required hand-held assistance from the therapist to return to the examination room due to DOE, increased LE weakness, increased foot drag and loss of balance.  Naliya has experienced multiple falls when ambulating in her home environment.  Krystian's currently has stairs to enter her home; she is not able to construct a ramp and is planning on moving to a more handicap accessible home (level entry or ramp) so that her living environment is accessible for the use of a power scooter (POV).  Nicholl is unable to propel a lightweight or ultra-lightweight manual wheelchair due to shoulder weakness and impaired pulmonary endurance.  Marisue would also be unable to independently load and unload a manual wheelchair into and out of her SUV; Fritzi's goal is to be able to perform community without the assistance of her family.  A power scooter and lift on the back of her SUV would allow Timiyah to be independent with IADL's.  Lorissa requires the use of a power scooter (POV) for more independent mobility to safely perform MRADLs within her home environment, for energy conservation and to reduce her risk for falls.         Anterior / Posterior Obliquity Rotation-Pelvis ?????  PELVIS    [] [x] []  Neutral Posterior Anterior  [] [] [x]  WFL Rt elev Lt elev   [] [] [x]  WFL Right Left                      Anterior    Anterior     [] Fixed [] Other [x] Partly Flexible [] Flexible   [] Fixed [] Other [x] Partly Flexible  [] Flexible  [] Fixed [] Other [x] Partly Flexible  [] Flexible   TRUNK  [] [x] []  WFL ? Thoracic ? Lumbar  Kyphosis Lordosis  [] [] [x]  WFL Convex Convex  Right Left [x]c-curve []s-curve []multiple  [] Neutral [] Left-anterior [x] Right-anterior     [] Fixed [] Flexible [x] Partly Flexible [] Other  [] Fixed [] Flexible [] Partly Flexible [] Other  [] Fixed             [] Flexible [x] Partly Flexible [] Other    Position Windswept  Hips externally rotated  HIPS          []           [x]              []  Neutral       Abduct        ADduct         [x]          []           []  Neutral Right           Left      [] Fixed [] Subluxed [x] Partly Flexible [] Dislocated [] Flexible  [] Fixed [] Other [] Partly Flexible  [] Flexible                 Foot Positioning Knee Positioning  ?????    [] [facility]  []Lt []Rt [x] [facility]  []Lt []Rt    KNEES ROM concerns: ROM concerns:    & Dorsi-Flexed []Lt []Rt ?????    FEET Plantar Flexed []Lt []Rt      Inversion                 []Lt []Rt      Eversion                 [x]Lt [x]Rt     HEAD [] Functional [x] Good Head Control  ?????  & [x] Flexed         [] Extended [] Adequate Head Control    NECK [] Rotated  Lt  [] Lat Flexed Lt [] Rotated  Rt [] Lat Flexed Rt [] Limited Head Control     [] Cervical Hyperextension [] Absent  Head Control     SHOULDERS ELBOWS WRIST& HAND ?????      Left     Right    Left     Right    Left     Right   U/E [x]Functional           [x]Functional [facility] []Fisting             []Fisting      []elev   []dep      []elev   []dep       []pro -[]retract     []pro  []retract []subluxed             []subluxed           Goals for Wheelchair Mobility  [x] Independence with mobility in the home with motor related ADLs (MRADLs)  [x]  Independence with MRADLs in the community [] Provide dependent mobility  [] Provide recline     []Provide tilt   Goals for Seating system [] Optimize pressure distribution [] Provide support needed to facilitate function or safety [] Provide corrective forces to assist with maintaining or improving posture [] Accommodate client's posture:   current seated postures and positions are not flexible or will not tolerate corrective forces [] Client to be independent with relieving pressure in the wheelchair []Enhance physiological function such as breathing, swallowing, digestion  Simulation ideas/Equipment trials:????? State why other equipment was unsuccessful:?????   MOBILITY BASE RECOMMENDATIONS and JUSTIFICATION: MOBILITY COMPONENT JUSTIFICATION  Manufacturer: [employer] Model: [employer], broadcasting/film/video 3 Wheel   Size: Width ?????Seat Depth ????? [x]provide transport from point A to B      [x]promote Indep mobility  [x]is not a safe, functional ambulator [x]walker or cane inadequate []non-standard width/depth necessary to accommodate anatomical measurement [] ?????  []Manual Mobility Base []non-functional ambulator    [x]Scooter/POV  [x]can safely operate  [x]can safely transfer   [x]has adequate trunk stability  [x]cannot functionally propel manual w/c  []Power  Mobility Base  []non-ambulatory  []cannot functionally propel manual wheelchair  [] cannot functionally and safely operate scooter/POV []can safely operate and willing to  []Stroller Base []infant/child  []unable to propel manual wheelchair []allows for growth []non-functional ambulator []non-functional UE []Indep mobility is not a goal at this time  []Tilt  []Forward []Backward []Powered tilt  []Manual tilt  []change position against gravitational force on head and shoulders  []change position for pressure relief/cannot weight shift []transfers  []management of tone []rest periods []control edema []facilitate postural control   [] ?????  []Recline  []Power recline on power base []Manual recline on manual base  []accommodate femur to back angle  []bring to full recline for ADL care  []change position for pressure relief/cannot weight shift []rest periods []repositioning for transfers or clothing/diaper /catheter changes []head positioning  []Lighter weight required []self- propulsion  []lifting [] ?????  []Heavy Duty required []user weight greater than 250# []extreme tone/ over active movement []broken frame on previous chair [] ?????  [] Back  [] Angle Adjustable [] Custom molded ????? []postural control []control of tone/spasticity []accommodation of range of motion []UE functional control []accommodation for seating system [] ????? []provide lateral trunk support []accommodate deformity []provide posterior trunk support []provide lumbar/sacral support []support trunk in midline []Pressure relief over spinal processes  [] Seat Cushion ????? []impaired sensation  []decubitus ulcers present []history of pressure ulceration []prevent pelvic extension []low maintenance  []stabilize pelvis  []accommodate obliquity []accommodate multiple deformity []neutralize lower extremity position []increase pressure distribution [] ?????  [] Pelvic/thigh support  [] Lateral thigh guide [] Distal medial pad  [] Distal lateral pad [] pelvis in neutral []accommodate pelvis [] position upper legs [] alignment [] accommodate ROM [] decr adduction []accommodate tone []removable for transfers []decr abduction  [] Lateral trunk Supports [] Lt     [] Rt []decrease lateral trunk leaning []control tone []contour for increased contact []safety  []accommodate asymmetry [] ?????  [] Mounting hardware  []lateral trunk supports  []back   []seat []headrest      [] thigh support []fixed   []swing away []attach seat platform/cushion to w/c frame []attach back cushion to w/c frame []mount postural supports []mount  headrest  []swing medial thigh support away []swing lateral supports away for transfers  [] ?????    Armrests  []fixed []adjustable height []removable   []swing away  []flip back   []reclining []full length pads []desk    []pads tubular  []provide support with elbow at 90   []provide support for w/c tray []change of height/angles for variable activities []remove for transfers []allow to come closer to table top []remove for access to tables [] ?????  Hangers/ Leg rests  []60 []70 []90 []elevating []heavy duty  []articulating []fixed []lift off []swing away     []power []provide LE support  []accommodate to hamstring tightness []elevate legs during recline   []provide change in position for Legs []Maintain placement of feet on footplate []durability []enable transfers []decrease edema []Accommodate lower leg length [] ?????  Foot support Footplate    []Lt  [] Rt  [] Center mount []flip up     []depth/angle adjustable []Amputee adapter    [] Lt     [] Rt []provide foot support []accommodate to ankle ROM []transfers []Provide support for residual extremity [] allow foot to go under wheelchair base [] decrease tone  [] ?????  [] Ankle strap/heel loops []support foot on foot support []decrease extraneous movement []provide input to heel  []protect  foot  Tires: []pneumatic  []flat free inserts  []solid  []decrease maintenance  []prevent frequent flats []increase shock absorbency []decrease pain from road shock []decrease spasms from road shock [] ?????  [] Headrest  []provide posterior head support []provide posterior neck support []provide lateral head support []provide anterior head support []support during tilt and recline []improve feeding   []improve respiration []placement of switches []safety  []accommodate ROM  []accommodate tone []improve visual orientation  [] Anterior chest strap [] Vest [] Shoulder retractors  []decrease forward movement of  shoulder []accommodation of TLSO []decrease forward movement of trunk []decrease shoulder elevation []added abdominal support []alignment []assistance with shoulder control  [] ?????  Pelvic Positioner []Belt []SubASIS bar []Dual Pull []stabilize tone []decrease falling out of chair/ **will not Decr potential for sliding due to pelvic tilting []prevent excessive rotation []pad for protection over boney prominence []prominence comfort []special pull angle to control rotation [] ?????  Upper Extremity Support []L   [] R []Arm trough    []hand support [] tray       []full tray []swivel mount []decrease edema      []decrease subluxation   []control tone   []placement for AAC/Computer/EADL []decrease gravitational pull on shoulders []provide midline positioning []provide support to increase UE function []provide hand support in natural position []provide work surface   POWER WHEELCHAIR CONTROLS  []Proportional  []Non-Proportional Type ????? []Left  []Right []provides access for controlling wheelchair   []lacks motor control to operate proportional drive control []unable to understand proportional controls  Actuator Control Module  []Single  []Multiple   []Allow the client to operate the power seat function(s) through the joystick control   []Safety Reset Switches []Used to change modes and stop the wheelchair when driving in latch mode    []Upgraded Electronics   []programming for accurate control []progressive Disease/changing condition []non-proportional drive control needed []Needed in order to operate power seat functions through joystick control   []Display box []Allows user to see in which mode and drive the wheelchair is set  []necessary for alternate controls    []Digital interface electronics []Allows w/c to operate when using alternative drive controls  []ASL Head Array []Allows client to operate wheelchair  through switches placed in tri-panel headrest  []Sip and  puff with tubing kit []needed to operate sip and puff drive controls  []Upgraded tracking electronics []increase safety when driving []correct tracking when on uneven surfaces  []Mount for switches or joystick []Attaches switches to w/c  []Swing away for access or transfers []midline for optimal placement []provides for consistent access  []Attendant controlled joystick plus mount []safety []long distance driving []operation of seat functions []compliance with transportation regulations [] ?????    Rear wheel placement/Axle adjustability []None []semi adjustable []fully adjustable  []improved UE access to wheels []improved stability []changing angle in space for improvement of postural stability []1-arm drive access []amputee pad placement [] ?????  Wheel rims/ hand rims  []metal  []plastic coated []oblique projections []vertical projections []Provide ability to propel manual wheelchair  [] Increase self-propulsion with hand weakness/decreased grasp  Push handles []extended  []angle adjustable  []standard []caregiver access []caregiver assist []allows "hooking" to enable increased ability to perform ADLs or maintain balance  One armed device  []Lt   []Rt []enable propulsion of manual wheelchair with one arm   [] ?????   Brake/wheel lock extension [] Lt   [] Rt []increase indep in applying wheel locks   []Side guards []prevent clothing getting caught in wheel or becoming  soiled [] prevent skin tears/abrasions  Battery: ????? []to power wheelchair ?????  Other: ????? ????? ?????  The above equipment has a life- long use expectancy. Growth and changes in medical and/or functional conditions would be the exceptions. This is to certify that the therapist has no financial relationship with durable medical provider or manufacturer. The therapist will not receive remuneration of any kind for the equipment recommended in this evaluation.   Patient has mobility limitation that significantly  impairs safe, timely participation in one or more mobility related ADL's.  (bathing, toileting, feeding, dressing, grooming, moving from room to room)                                                             [x] Yes [] No Will mobility device sufficiently improve ability to participate and/or be aided in participation of MRADL's?         [x] Yes [] No Can limitation be compensated for with use of a cane or walker?                                                                                [] Yes [x] No Does patient or caregiver demonstrate ability/potential ability & willingness to safely use the mobility device?   [x] Yes [] No Does patient's home environment support use of recommended mobility device?                                                    [] Yes [] No Does patient have sufficient upper extremity function necessary to functionally propel a manual wheelchair?    [] Yes [x] No Does patient have sufficient strength and trunk stability to safely operate a POV (scooter)?                                  [x] Yes [] No Does patient need additional features/benefits provided by a power wheelchair for MRADL's in the home?       [] Yes [] No Does the patient demonstrate the ability to safely use a power wheelchair?                                                              [] Yes [] No  Therapist Name Printed: Tilda Burrow. Melrose Nakayama, PT, DPT Date: 11.30.2021  Therapist's Signature:   Date:   Supplier's Name Printed: [employer] Date: 11.30.2021  Supplier's Signature:   Date:  Patient/Caregiver Signature:   Date:     This is to certify that I have read  this evaluation and do agree with the content within:      Physician's Name Printed: Richrd Humbles, MD  73 Signature:  Date:     This is to certify that I, the above signed therapist have the following affiliations: [] This DME provider [] Manufacturer of recommended equipment [] Patient's long term care  facility [x] None of the above      Objective measurements completed on examination: See above findings.       PT Education - 11/25/20 2057    Education Details educated on pros/cons of power scooter vs. power wheelchair.  Educated on process for obtaining power mobility device.    Person(s) Educated Patient    Methods Explanation    Comprehension Verbalized understanding                       Plan - 11/25/20 2058    Clinical Impression Statement Pt referred to neuro [facility] for evaluation for power mobility due to decline in safe and independent functional mobility in patient's home and community and is experiencing multiple falls.  Pt's evaluation revealed the following impairments and functional limitations: impaired UE and LE strength, impaired LE ROM, impaired cardiovascular endurance and activity tolerance, abnormal gait, impaired balance, impaired posture and pain in low back.  Pt currently lives in an apartment that has stairs to enter but is currently looking for a home with a level entry.  Pt would benefit from power mobility (POV vs. power w/c) to improve ability to perform her MRADL's safely and independently in her home and community and to decrease her risk for falls.    Personal Factors and Comorbidities Comorbidity 3+;Fitness;Past/Current Experience;Social Background    Comorbidities Quadriplegic infantile cerebral palsy, OA of knee, juvenile idiopathic scoliosisAnkle fusion, LE surgery, tracheostomy    Examination-Activity Limitations Dressing;Locomotion Level;Stand;Toileting;Transfers    Examination-Participation Restrictions Cleaning;Community Activity;Laundry;Meal Prep    Stability/Clinical Decision Making Evolving/Moderate complexity    Clinical Decision Making Moderate    Rehab Potential Good    PT Frequency One time visit    PT Duration Other (comment)   one time visit for w/c evaluation only   PT Treatment/Interventions Other (comment)   w/c management    Consulted and Agree with Plan of Care Patient           Patient will benefit from skilled therapeutic intervention in order to improve the following deficits and impairments:  Abnormal gait, Decreased balance, Decreased activity tolerance, Cardiopulmonary status limiting activity, Decreased endurance, Decreased mobility, Decreased strength, Difficulty walking, Increased edema, Impaired UE functional use, Postural dysfunction, Pain  Visit Diagnosis: Repeated falls  Other abnormalities of gait and mobility  Other symptoms and signs involving the musculoskeletal system  Muscle weakness (generalized)  Unsteadiness on feet  Abnormal posture     Problem List Patient Active Problem List   Diagnosis Date Noted  . Neurogenic bladder 06/12/2020  . HSV (herpes simplex virus) infection 07/16/2019  . Incontinence 07/16/2019  . 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 Bilateral primary osteoarthritis of knee 05/30/2019  . Cerebral palsy ([place]) 05/30/2019  . Trichomonas vaginitis 12/28/2017  . 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 Bilateral ankle pain 07/30/2014  . Acquired pes planus of right foot 06/10/2014  . Acquired deformity of ankle and foot 10/01/2013  . Other acquired deformities of unspecified foot 10/01/2013  . Hallux valgus with bunions 07/27/2013  . Other specified dermatoses 07/27/2013  . Pes planus 07/27/2013  . Primary osteoarthritis, unspecified ankle and  foot 07/27/2013  . Sebaceous cyst 07/27/2013  . Skin disorder 07/27/2013  . Vulvar discomfort 07/27/2013  . 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 Paresthesias 07/06/2012  . Skin sensation disturbance 07/06/2012    Rico Junker, PT, DPT 11/25/20    9:06 PM    [facility] [address] [place], [place], [address] Phone: [phone]   Fax:  [phone]  Name: JAX ABDELRAHMAN MRN:  [id number] Date of Birth: 07/01/1976

## 2021-01-08 IMAGING — MG DIGITAL SCREENING BILATERAL MAMMOGRAM WITH TOMO AND CAD
8 series · 9 of 24 positions shown · non-contrast
Comparison: Previous exam(s).

CLINICAL DATA: Screening.

EXAM:
DIGITAL SCREENING BILATERAL MAMMOGRAM WITH TOMO AND CAD

[R CC synth-2D]
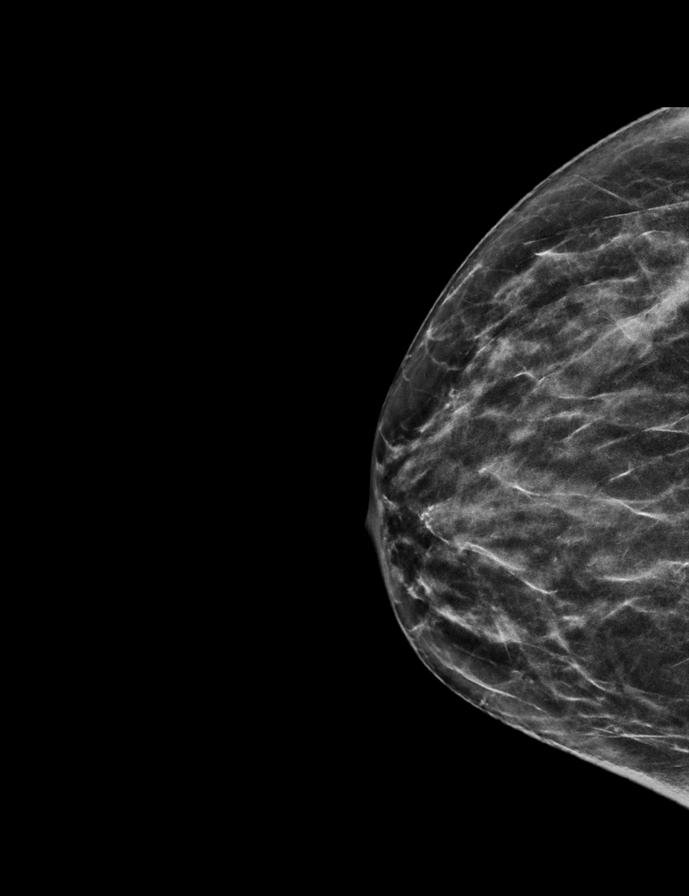

[L CC synth-2D]
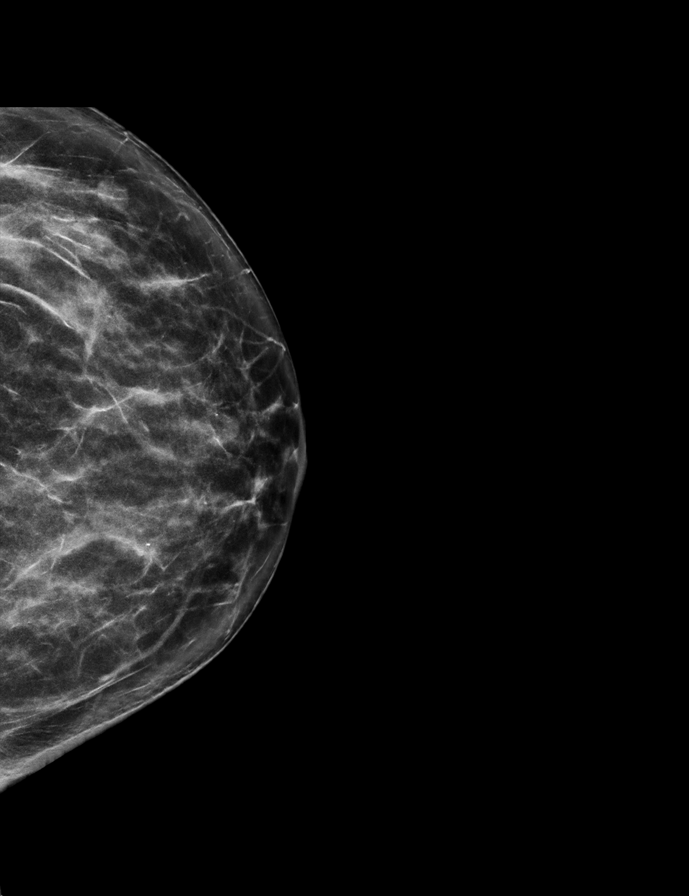

[L MLO synth-2D]
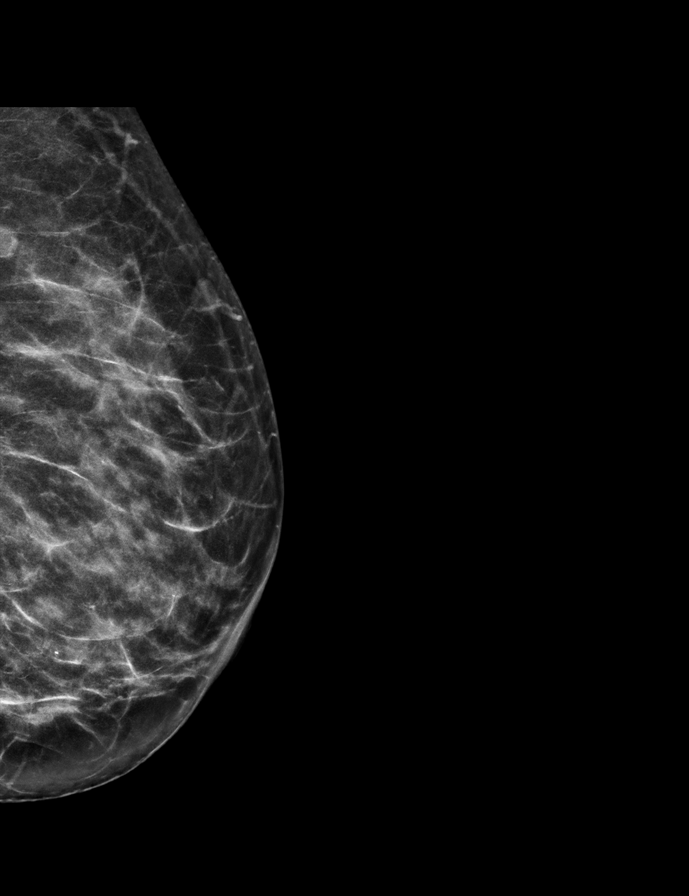

[R MLO synth-2D]
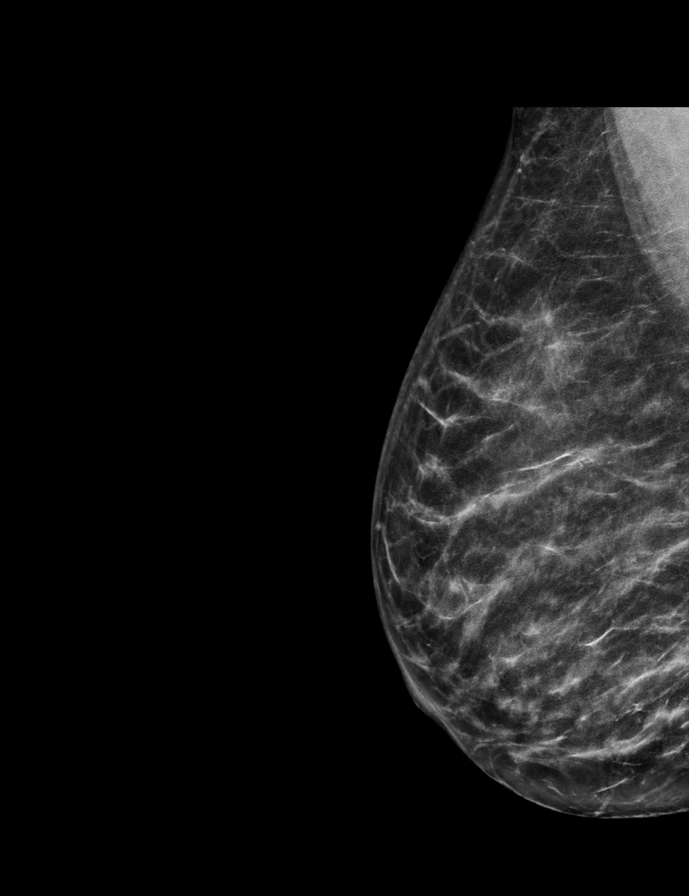

[R CC tomo · 2 of 56 frames shown]
[frame 19/56]
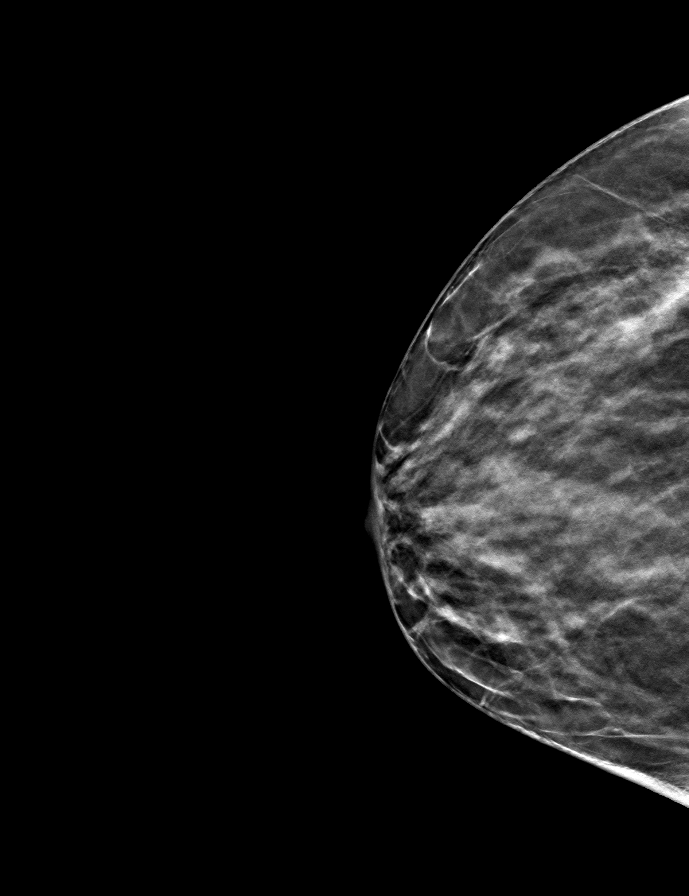
[frame 29/56]
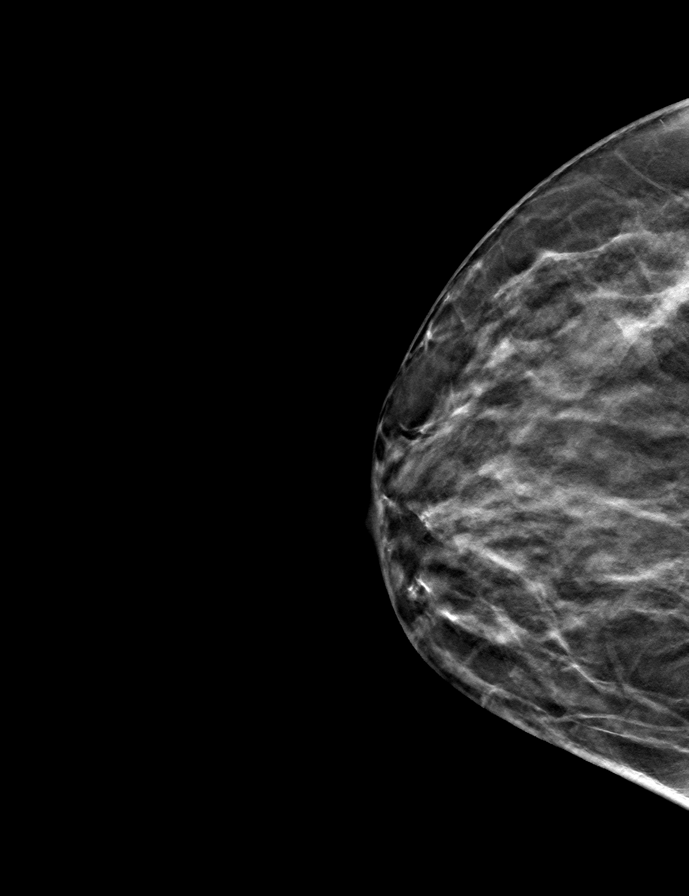

[L MLO tomo · tomo slice 30/59.0]
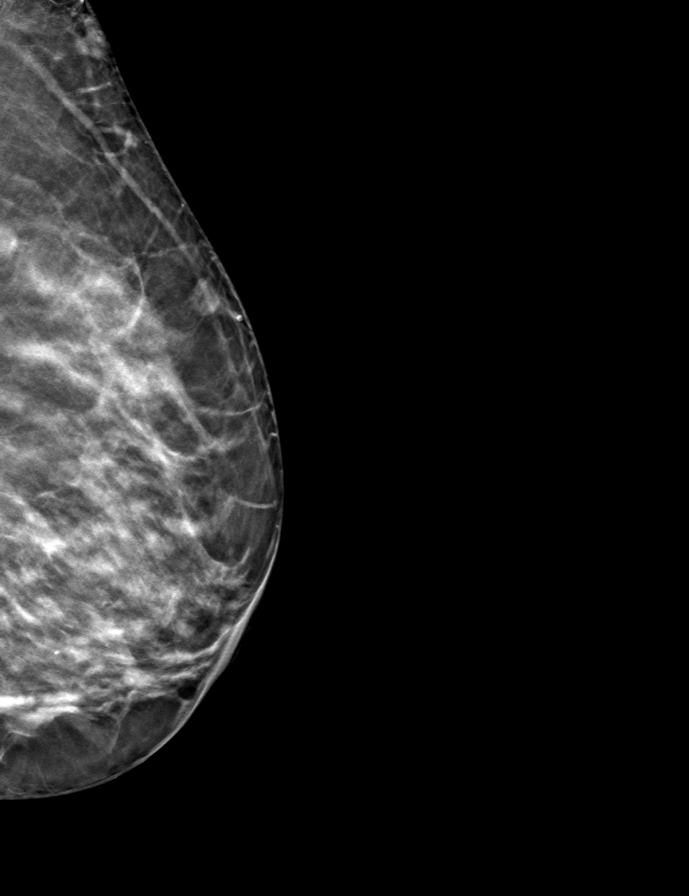

[L CC tomo · tomo slice 33/64.0]
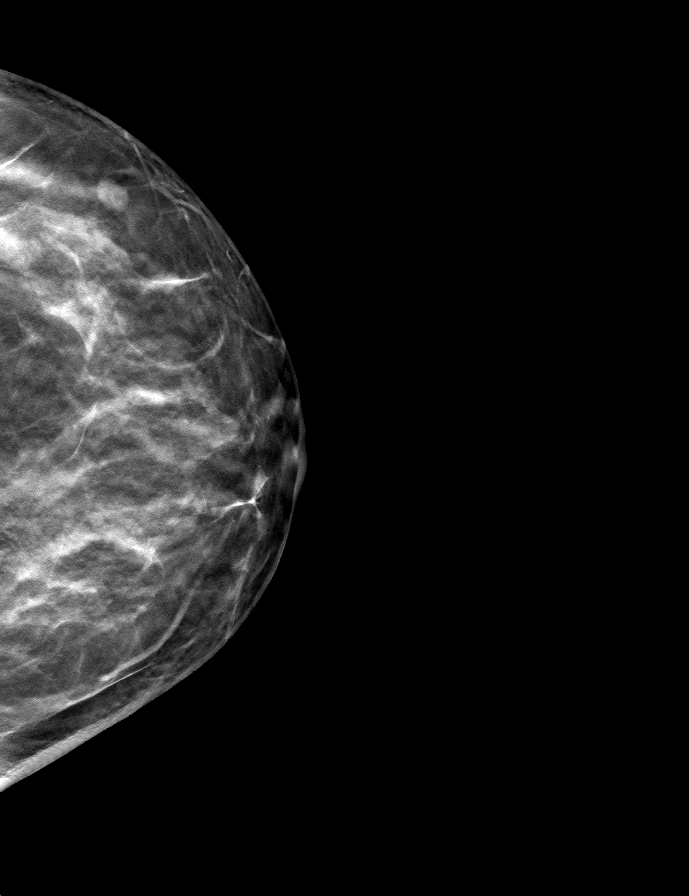

[R MLO tomo · tomo slice 31/60.0]
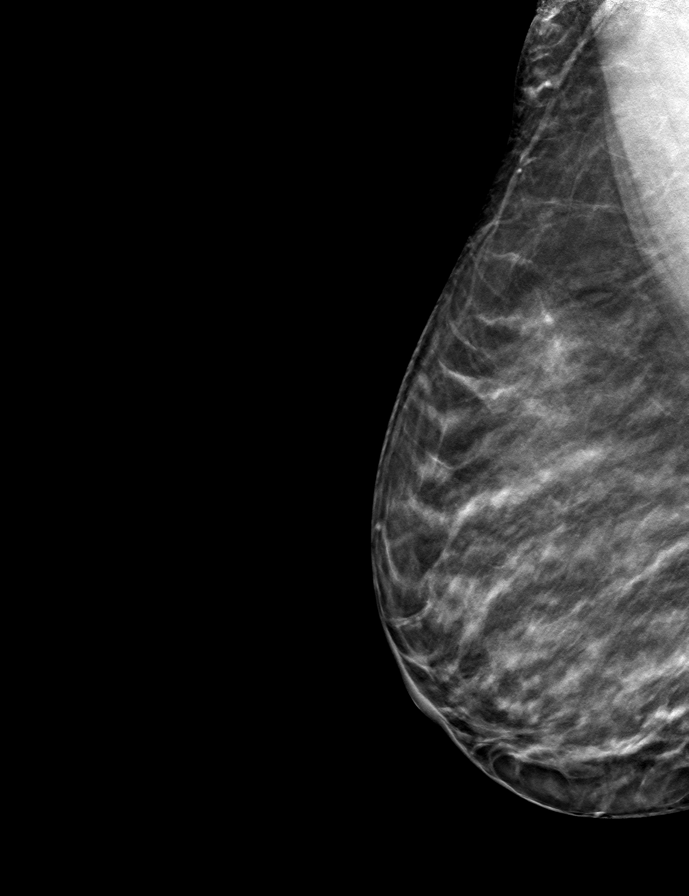

[9 of 24 positions shown; findings below may reference images not displayed]

ACR Breast Density Category c: The breast tissue is heterogeneously
dense, which may obscure small masses.
FINDINGS: There are no findings suspicious for malignancy. Images were
processed with CAD.
IMPRESSION: No mammographic evidence of malignancy. A result letter of this
screening mammogram will be mailed directly to the patient.

RECOMMENDATION:
Screening mammogram in one year. (Code:[id number])

BI-RADS CATEGORY  1: Negative.

## 2021-01-29 ENCOUNTER — Encounter (HOSPITAL_COMMUNITY): Payer: Self-pay | Admitting: Emergency Medicine

## 2021-01-29 ENCOUNTER — Other Ambulatory Visit: Payer: Self-pay

## 2021-01-29 ENCOUNTER — Ambulatory Visit (INDEPENDENT_AMBULATORY_CARE_PROVIDER_SITE_OTHER): Payer: Medicare Other

## 2021-01-29 ENCOUNTER — Ambulatory Visit (HOSPITAL_COMMUNITY)
Admission: EM | Admit: 2021-01-29 | Discharge: 2021-01-29 | Disposition: A | Discharge status: Home / Self Care | Payer: Medicare Other | Attending: Family Medicine | Admitting: Family Medicine

## 2021-01-29 DIAGNOSIS — R0789 Other chest pain: Secondary | ICD-10-CM | POA: Diagnosis not present

## 2021-01-29 DIAGNOSIS — R079 Chest pain, unspecified: Secondary | ICD-10-CM

## 2021-01-29 DIAGNOSIS — N3090 Cystitis, unspecified without hematuria: Secondary | ICD-10-CM

## 2021-01-29 LAB — POCT URINALYSIS DIPSTICK, ED / UC
Bilirubin Urine: NEGATIVE
Glucose, UA: NEGATIVE mg/dL
Ketones, ur: NEGATIVE mg/dL
Nitrite: NEGATIVE
Protein, ur: NEGATIVE mg/dL
Specific Gravity, Urine: >=1.03 (ref 1.005–1.030)
Urobilinogen, UA: 1 mg/dL (ref 0.0–1.0)
pH: 5.5 (ref 5.0–8.0)

## 2021-01-29 LAB — POC URINE PREG, ED: Preg Test, Ur: NEGATIVE

## 2021-01-29 MED ORDER — MELOXICAM 7.5 MG PO TABS
7.5000 mg | ORAL_TABLET | Freq: Every day | ORAL | 0 refills | Status: DC
Start: 2021-01-29 — End: 2021-11-17

## 2021-01-29 MED ORDER — CEPHALEXIN 500 MG PO CAPS
500.0000 mg | ORAL_CAPSULE | Freq: Two times a day (BID) | ORAL | 0 refills | Status: DC
Start: 2021-01-29 — End: 2022-07-05

## 2021-01-29 NOTE — Discharge Instructions (Signed)
We have sent your urine sample for culture. We will call you with any significant abnormalities or if there is need to begin or change treatment or pursue further follow up.  You may also review your test results online through MyChart. If you do not have a MyChart account, instructions to sign up should be on your discharge paperwork.  

## 2021-01-29 NOTE — ED Triage Notes (Signed)
Pt presents with right side pain that started today.

## 2021-02-03 NOTE — ED Provider Notes (Signed)
[facility]   [id number] 01/29/21 Arrival Time: 1255  ASSESSMENT & PLAN:  1. Acute chest wall pain   2. Cystitis     I have personally viewed the imaging studies ordered this visit. Normal CXR. No pneumothorax.  Begin: Meds ordered this encounter  Medications  . meloxicam (MOBIC) 7.5 MG tablet    Sig: Take 1 tablet (7.5 mg total) by mouth daily.    Dispense:  10 tablet    Refill:  0  . cephALEXin (KEFLEX) 500 MG capsule    Sig: Take 1 capsule (500 mg total) by mouth 2 (two) times daily.    Dispense:  10 capsule    Refill:  0      Discharge Instructions     We have sent your urine sample for culture. We will call you with any significant abnormalities or if there is need to begin or change treatment or pursue further follow up.  You may also review your test results online through MyChart. If you do not have a MyChart account, instructions to sign up should be on your discharge paperwork.      Reviewed expectations re: course of current medical issues. Questions answered. Outlined signs and symptoms indicating need for more acute intervention. Patient verbalized understanding. After Visit Summary given.   SUBJECTIVE:  History from: patient. Amber Juarez is a 45 y.o. female who reports R-sided chest wall discomfort; noted today; dull; certain mvmts worsen. No abd or back pain. No assoc SOB/n/v/diaphoresis. Also with urinary freq for few days. No gross hematuria. Afebrile. Ambulatory without difficulty. No vaginal d/c.   Social History   Tobacco Use  Smoking Status Never Smoker  Smokeless Tobacco Never Used   Social History   Substance and Sexual Activity  Alcohol Use No     OBJECTIVE:  Vitals:   01/29/21 1348  BP: (!) 142/83  Pulse: 74  Resp: 17  Temp: 98.3 F (36.8 C)  TempSrc: Oral  SpO2: 100%    General appearance: alert, oriented, no acute distress Eyes: PERRLA; EOMI; conjunctivae normal HENT: normocephalic;  atraumatic Neck: supple with FROM Lungs: without labored respirations; speaks full sentences without difficulty; CTAB Heart: regular rate and rhythm Chest Wall: without tenderness to palpation over R anterior Abdomen: soft, non-tender; no guarding or rebound tenderness Extremities: without edema; without calf swelling or tenderness; symmetrical without gross deformities Skin: warm and dry; without rash or lesions Neuro: normal gait Psychological: alert and cooperative; normal mood and affect  Labs: Results for orders placed or performed during the hospital encounter of 01/29/21  POC urine pregnancy  Result Value Ref Range   Preg Test, Ur NEGATIVE NEGATIVE  POC Urinalysis dipstick  Result Value Ref Range   Glucose, UA NEGATIVE NEGATIVE mg/dL   Bilirubin Urine NEGATIVE NEGATIVE   Ketones, ur NEGATIVE NEGATIVE mg/dL   Specific Gravity, Urine >=1.030 1.005 - 1.030   Hgb urine dipstick TRACE (A) NEGATIVE   pH 5.5 5.0 - 8.0   Protein, ur NEGATIVE NEGATIVE mg/dL   Urobilinogen, UA 1.0 0.0 - 1.0 mg/dL   Nitrite NEGATIVE NEGATIVE   Leukocytes,Ua SMALL (A) NEGATIVE   Labs Reviewed  POCT URINALYSIS DIPSTICK, ED / UC - Abnormal; Notable for the following components:      Result Value   Hgb urine dipstick TRACE (*)    Leukocytes,Ua SMALL (*)    All other components within normal limits  POC URINE PREG, ED    Imaging: DG Chest 2 View  Result Date: 01/29/2021 CLINICAL  DATA:  45 year old female with right-sided chest pain. EXAM: CHEST - 2 VIEW COMPARISON:  Chest radiograph dated 02/10/2016. FINDINGS: No focal consolidation, pleural effusion or pneumothorax. Probable punctate right lower lobe calcified granuloma. The cardiac silhouette is within limits. No acute osseous pathology. IMPRESSION: No active cardiopulmonary disease. Electronically Signed   By: Elgie Collard M.D.   On: 01/29/2021 15:02     Allergies  Allergen Reactions  . Shellfish Allergy Anaphylaxis  . Bactrim  [Sulfamethoxazole-Trimethoprim] Hives and Swelling    Patient had diffuse urticarial rash at visit with reported oral swelling and difficulty breathing that had resided prior to evaluation    Past Medical History:  Diagnosis Date  . Arthritis   . CP (cerebral palsy) ([facility])    Social History   Socioeconomic History  . Marital status: Divorced    Spouse name: Not on file  . Number of children: Not on file  . Years of education: Not on file  . Highest education level: Not on file  Occupational History  . Not on file  Tobacco Use  . Smoking status: Never Smoker  . Smokeless tobacco: Never Used  Substance and Sexual Activity  . Alcohol use: No  . Drug use: No  . Sexual activity: Yes    Birth control/protection: None  Other Topics Concern  . Not on file  Social History Narrative  . Not on file   Social Determinants of Health   Financial Resource Strain: Not on file  Food Insecurity: Not on file  Transportation Needs: Not on file  Physical Activity: Not on file  Stress: Not on file  Social Connections: Not on file  Intimate Partner Violence: Not on file   Family History  Problem Relation Age of Onset  . Hypertension Mother    Past Surgical History:  Procedure Laterality Date  . ANKLE FUSION    . LEG SURGERY    . Candice Camp, MD 02/03/21 [phone]

## 2021-03-30 ENCOUNTER — Telehealth: Payer: Self-pay

## 2021-03-30 NOTE — Telephone Encounter (Signed)
Patient came into the office she is requesting to be worked into Dr.Hilts schedule for a cortisone injection. Patient stated she fell and is in pain call back:[phone]

## 2021-03-30 NOTE — Telephone Encounter (Signed)
Ok to start the process if not done already.

## 2021-03-30 NOTE — Telephone Encounter (Signed)
Please advise 

## 2021-03-30 NOTE — Telephone Encounter (Signed)
Noted  

## 2021-03-30 NOTE — Telephone Encounter (Signed)
Patient came into the office she want to get pre approval process started for a gel injection. Last injection received was 08/11/2020 call back:[phone]

## 2021-03-31 NOTE — Telephone Encounter (Signed)
I called the patient - working her in tomorrow morning at 8:30.

## 2021-04-01 ENCOUNTER — Ambulatory Visit: Payer: Self-pay

## 2021-04-01 ENCOUNTER — Other Ambulatory Visit: Payer: Self-pay

## 2021-04-01 ENCOUNTER — Ambulatory Visit (INDEPENDENT_AMBULATORY_CARE_PROVIDER_SITE_OTHER): Payer: Medicare Other | Admitting: Family Medicine

## 2021-04-01 DIAGNOSIS — M1712 Unilateral primary osteoarthritis, left knee: Secondary | ICD-10-CM

## 2021-04-01 MED ORDER — FUROSEMIDE 20 MG PO TABS: ORAL_TABLET | ORAL | 1 refills | Status: DC

## 2021-04-01 MED ORDER — IBUPROFEN 800 MG PO TABS
800.0000 mg | ORAL_TABLET | Freq: Three times a day (TID) | ORAL | 3 refills | Status: DC | PRN
Start: 2021-04-01 — End: 2023-11-15

## 2021-04-01 NOTE — Progress Notes (Signed)
   Office Visit Note   Patient: Amber Juarez           Date of Birth: [birth year]           MRN: [id number] Visit Date: 04/01/2021 Requested by: Lavada Mesi, MD [address] [place],  [place] [address] PCP: Lavada Mesi, MD  Subjective: Chief Complaint  Patient presents with  . Left Knee - Pain    Fell 2 days ago - did not hit the knee. Pain in that knee, though. Requests a cortisone injection. Requests refill of ibuprofen and fluid pill.    HPI: She is here with left knee pain.  Pain and stiffness, requesting an injection.  We have also requested approval for gel injections.              ROS:   All other systems were reviewed and are negative.  Objective: Vital Signs: There were no vitals taken for this visit.  Physical Exam:  General:  Alert and oriented, in no acute distress. Pulm:  Breathing unlabored. Psy:  Normal mood, congruent affect  Left knee: 1+ effusion with no warmth.  Diffusely tender around the patellofemoral joint.  Imaging: Ultrasound-guided steroid injection: After sterile prep with Betadine, injected 3 cc 0.25% bupivacaine and 6 mg betamethasone into the superior lateral joint recess.  Assessment & Plan: 1.  Left knee osteoarthritis -Injection given as above.  Follow-up when needed for gel injections.     Procedures: No procedures performed        PMFS History: Patient Active Problem List   Diagnosis Date Noted  . Neurogenic bladder 06/12/2020  . HSV (herpes simplex virus) infection 07/16/2019  . Incontinence 07/16/2019  . 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 Bilateral primary osteoarthritis of knee 05/30/2019  . Cerebral palsy ([facility]) 05/30/2019  . Trichomonas vaginitis 12/28/2017  . 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 Bilateral ankle pain 07/30/2014  . Acquired pes planus of right foot 06/10/2014  . Acquired deformity of ankle and foot 10/01/2013  . Other acquired deformities of unspecified foot 10/01/2013   . Hallux valgus with bunions 07/27/2013  . Other specified dermatoses 07/27/2013  . Pes planus 07/27/2013  . Primary osteoarthritis, unspecified ankle and foot 07/27/2013  . Sebaceous cyst 07/27/2013  . Skin disorder 07/27/2013  . Vulvar discomfort 07/27/2013  . 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 Paresthesias 07/06/2012  . Skin sensation disturbance 07/06/2012   Past Medical History:  Diagnosis Date  . Arthritis   . CP (cerebral palsy) ([facility])     Family History  Problem Relation Age of Onset  . Hypertension Mother     Past Surgical History:  Procedure Laterality Date  . ANKLE FUSION    . LEG SURGERY    . TRACHEOSTOMY     Social History   Occupational History  . Not on file  Tobacco Use  . Smoking status: Never Smoker  . Smokeless tobacco: Never Used  Substance and Sexual Activity  . Alcohol use: No  . Drug use: No  . Sexual activity: Yes    Birth control/protection: None

## 2021-04-02 ENCOUNTER — Ambulatory Visit: Payer: Medicare Other | Admitting: Family Medicine

## 2021-04-06 ENCOUNTER — Telehealth: Payer: Self-pay | Admitting: Family Medicine

## 2021-04-06 ENCOUNTER — Telehealth: Payer: Self-pay

## 2021-04-06 MED ORDER — DICLOFENAC SODIUM 75 MG PO TBEC: 75.0000 mg | DELAYED_RELEASE_TABLET | Freq: Two times a day (BID) | ORAL | 3 refills | Status: DC | PRN

## 2021-04-06 NOTE — Telephone Encounter (Signed)
Please advise 

## 2021-04-06 NOTE — Telephone Encounter (Signed)
I called and advised the patient about the Rx for diclofenac.  The patient is asking about the progress on gel injection approval.

## 2021-04-06 NOTE — Telephone Encounter (Signed)
VOB has been submitted for SynviscOne, left knee. Pending BV. 

## 2021-04-06 NOTE — Telephone Encounter (Signed)
Tried calling patient back concerning gel injection, but no answer and Vm was full.  Not able to leave a message.

## 2021-04-06 NOTE — Telephone Encounter (Signed)
Patient called stating she can not put any weight down on her left leg. Patient said she is still in a lot of pain. Patient said she is waiting for a call concerning the approval for the gel injection. Patient asked if she can get a Rx called in for fluid on her knee? The number to contact patient is  [phone]

## 2021-04-06 NOTE — Telephone Encounter (Signed)
Diclofenac pills sent to pharmacy.

## 2021-04-07 ENCOUNTER — Telehealth: Payer: Self-pay

## 2021-04-07 NOTE — Telephone Encounter (Signed)
Submitted VOB for SynviscOne, right knee per patient.  Pending BV.

## 2021-04-08 ENCOUNTER — Telehealth: Payer: Self-pay

## 2021-04-08 NOTE — Telephone Encounter (Signed)
Approved for SynviscOne, bilateral knee. Buy & Bill Patient will be responsible for 20% OOP. No Co-pay required No PA required  Appt. 04/09/2021 with Dr. Prince Rome

## 2021-04-09 ENCOUNTER — Ambulatory Visit (INDEPENDENT_AMBULATORY_CARE_PROVIDER_SITE_OTHER): Payer: Medicare Other | Admitting: Family Medicine

## 2021-04-09 ENCOUNTER — Other Ambulatory Visit: Payer: Self-pay

## 2021-04-09 ENCOUNTER — Ambulatory Visit: Payer: Self-pay

## 2021-04-09 ENCOUNTER — Encounter: Payer: Self-pay | Admitting: Family Medicine

## 2021-04-09 DIAGNOSIS — M1712 Unilateral primary osteoarthritis, left knee: Secondary | ICD-10-CM

## 2021-04-09 DIAGNOSIS — M1711 Unilateral primary osteoarthritis, right knee: Secondary | ICD-10-CM

## 2021-04-09 MED ORDER — HYDROCODONE-HOMATROPINE 5-1.5 MG/5ML PO SYRP: 5.0000 mL | ORAL_SOLUTION | Freq: Four times a day (QID) | ORAL | 0 refills | Status: DC | PRN

## 2021-04-09 NOTE — Progress Notes (Signed)
   Office Visit Note   Patient: Amber Juarez           Date of Birth: 11-29-1976           MRN: [id number] Visit Date: 04/09/2021 Requested by: Lavada Mesi, MD [address] [place],  [place] [address] PCP: Lavada Mesi, MD  Subjective: Chief Complaint  Patient presents with  . Right Knee - Pain, Follow-up    Planned Synvisc One injections, bilaterally  . Left Knee - Pain, Follow-up    HPI: She is here for planned ultrasound-guided bilateral knee Synvisc 1 injections.  She is having a cough, states that she tested herself for Covid and it was negative.  She also wants to be tested for diabetes.                ROS:   All other systems were reviewed and are negative.  Objective: Vital Signs: There were no vitals taken for this visit.  Physical Exam:  General:  Alert and oriented, in no acute distress. Pulm:  Breathing unlabored. Psy:  Normal mood, congruent affect. Skin: No erythema Knees: 1+ effusion bilaterally.  Imaging: US Guided Needle Placement - No Linked Charges  Result Date: 04/09/2021 Ultrasound-guided bilateral knee injections: After sterile prep with Betadine, injected 3 cc 0.25% bupivacaine and Synvisc-1 from lateral midpatellar approach into the joint recess.     Assessment & Plan: 1.  Bilateral knee osteoarthritis -Synvisc 1 injections today.  Follow-up as needed.     Procedures: No procedures performed        PMFS History: Patient Active Problem List   Diagnosis Date Noted  . Neurogenic bladder 06/12/2020  . HSV (herpes simplex virus) infection 07/16/2019  . Incontinence 07/16/2019  . 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 Bilateral primary osteoarthritis of knee 05/30/2019  . Cerebral palsy ([facility]) 05/30/2019  . Trichomonas vaginitis 12/28/2017  . 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 Bilateral ankle pain 07/30/2014  . Acquired pes planus of right foot 06/10/2014  . Acquired deformity of ankle and foot  10/01/2013  . Other acquired deformities of unspecified foot 10/01/2013  . Hallux valgus with bunions 07/27/2013  . Other specified dermatoses 07/27/2013  . Pes planus 07/27/2013  . Primary osteoarthritis, unspecified ankle and foot 07/27/2013  . Sebaceous cyst 07/27/2013  . Skin disorder 07/27/2013  . Vulvar discomfort 07/27/2013  . 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 Paresthesias 07/06/2012  . Skin sensation disturbance 07/06/2012   Past Medical History:  Diagnosis Date  . Arthritis   . CP (cerebral palsy) ([facility])     Family History  Problem Relation Age of Onset  . Hypertension Mother     Past Surgical History:  Procedure Laterality Date  . ANKLE FUSION    . LEG SURGERY    . TRACHEOSTOMY     Social History   Occupational History  . Not on file  Tobacco Use  . Smoking status: Never Smoker  . Smokeless tobacco: Never Used  Substance and Sexual Activity  . Alcohol use: No  . Drug use: No  . Sexual activity: Yes    Birth control/protection: None

## 2021-10-30 ENCOUNTER — Ambulatory Visit: Payer: Self-pay

## 2021-10-30 ENCOUNTER — Ambulatory Visit (INDEPENDENT_AMBULATORY_CARE_PROVIDER_SITE_OTHER): Payer: Medicare Other | Admitting: Physician Assistant

## 2021-10-30 ENCOUNTER — Encounter: Payer: Self-pay | Admitting: Physician Assistant

## 2021-10-30 DIAGNOSIS — M25571 Pain in right ankle and joints of right foot: Secondary | ICD-10-CM | POA: Diagnosis not present

## 2021-10-30 MED ORDER — MELOXICAM 7.5 MG PO TABS
7.5000 mg | ORAL_TABLET | Freq: Every day | ORAL | 0 refills | Status: DC
Start: 2021-10-30 — End: 2021-11-17

## 2021-10-30 NOTE — Progress Notes (Addendum)
Office Visit Note   Patient: Amber Juarez           Date of Birth: April 17, 1976           MRN: [id number] Visit Date: 10/30/2021              Requested by: Lavada Mesi, MD [address] [place],  [place] [address] PCP: Lavada Mesi, MD  No chief complaint on file.     HPI: Patient is a 45 year old woman with a history of cerebral palsy and spasticity.  She comes in today complaining of severe pain throughout her right foot only with weightbearing.  She denies any injury and states this been going on about a week.  She denies any fever or chills.  She denies any paresthesias.  She denies any pain whatsoever at rest.  She states as soon as she tries to put weight on her foot she "just cannot "she was seen by Dr. Lajoyce Corners in 2018 and was given a subtalar injection but was told that she may likely need a subtalar arthrodesis.  She is currently taking ibuprofen for pain.  She has had injections in her knees also.  She is just wondering if there is an injection that can make her foot feel better  Assessment & Plan: Visit Diagnoses:  1. 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  On physical exam I cannot reproduce any of her symptoms when she is not weightbearing.  I do believe this is functional with weightbearing and this has progressed.  I have asked that she use her power chair and would like to put her in a short cam boot and have her be on crutches or a walker.  She declines use of the walker declines use of the crutches and has declined use of short boot.  I did give her my concerns with her stability as she has had falls before and she understands this.  I did give her prescription for meloxicam.  I will have her follow-up with Dr. Lajoyce Corners next week as I think the next step would more than likely be at  least a subtalar arthrodesis.  Discussed the case with Dr. Roda Shutters   Follow-Up Instructions: No follow-ups on file.   Ortho Exam  Patient is alert, oriented, no adenopathy, well-dressed, normal affect, normal respiratory effort. Right foot: She has a severe antalgic gait with complete planovalgus collapse and external rotation of her foot.  She is very painful when she first stands up seems to get slightly better with ambulation.  She has an easily palpable dorsalis pedis pulse.  Her foot is warm.  Only mild soft tissue swelling she has no erythema no cellulitis no skin breakdown.  She has no tenderness through any of the midfoot joints to palpation though when she ambulates this is where she states it hurts the most.  No tenderness with manipulation of Lisfranc joint.  No tenderness over the talonavicular or the calcaneocuboid joint.  No tenderness with manipulation of the subtalar joint.  She does have obvious contractures does not come to a neutral position.  No tenderness with palpation or lateral impingement She has no posterior Tibial Tendon function  Imaging: XR Foot Complete Right  Result Date: 10/30/2021 Three-view radiographs of her right foot were obtained today.  They demonstrate severe planovalgus collapse.  She has advanced arthritis of the calcaneal cuboid joint and the talonavicular joint with almost complete obliteration and loss of joint space and subchondral cyst formation.  No acute osseous injuries no destructive process is seen  Subtalar joint difficult to visualize given collapse through her foot No images are attached to the encounter.  Labs: Lab Results  Component Value Date   HGBA1C 5.7 (H) 07/31/2020   REPTSTATUS 06/06/2020 FINAL 06/05/2020   CULT MULTIPLE SPECIES PRESENT, SUGGEST RECOLLECTION (A) 06/05/2020   LABORGA PROTEUS MIRABILIS (A) 09/27/2018     Lab Results  Component Value Date   ALBUMIN 3.7 06/15/2013    No results found for: MG No results found for:  VD25OH  No results found for: PREALBUMIN CBC EXTENDED Latest Ref Rng & Units 04/16/2020 06/15/2013  WBC 3.8 - 10.8 Thousand/uL 5.6 7.8  RBC 3.80 - 5.10 Million/uL 5.50(H) 4.55  HGB 11.7 - 15.5 g/dL 45.0 38.8  HCT 82.8 - 00.3 % 43.6 36.6  PLT 140 - 400 Thousand/uL 274 280  NEUTROABS 1,500 - 7,800 cells/uL 3,349 4.6  LYMPHSABS 850 - 3,900 cells/uL 1,462 2.0     There is no height or weight on file to calculate BMI.  Orders:  Orders Placed This Encounter  Procedures   XR Foot Complete Right   Meds ordered this encounter  Medications   meloxicam (MOBIC) 7.5 MG tablet    Sig: Take 1 tablet (7.5 mg total) by mouth daily.    Dispense:  30 tablet    Refill:  0     Procedures: No procedures performed  Clinical Data: No additional findings.  ROS:  All other systems negative, except as noted in the HPI. Review of Systems  Objective: Vital Signs: There were no vitals taken for this visit.  Specialty Comments:  No specialty comments available.  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   Bilateral ankle pain 07/30/2014   Acquired pes planus of right foot 06/10/2014   Acquired deformity of ankle and foot 10/01/2013   Other acquired deformities of unspecified foot 10/01/2013   Hallux valgus with bunions 07/27/2013   Other specified dermatoses 07/27/2013   Pes planus 07/27/2013   Primary osteoarthritis, unspecified ankle and foot 07/27/2013   Sebaceous cyst 07/27/2013   Skin disorder 07/27/2013   Vulvar discomfort 07/27/2013   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

## 2021-11-09 ENCOUNTER — Ambulatory Visit (INDEPENDENT_AMBULATORY_CARE_PROVIDER_SITE_OTHER): Payer: Medicare Other | Admitting: Orthopedic Surgery

## 2021-11-09 ENCOUNTER — Encounter: Payer: Self-pay | Admitting: Orthopedic Surgery

## 2021-11-09 ENCOUNTER — Other Ambulatory Visit: Payer: Self-pay

## 2021-11-09 DIAGNOSIS — M25571 Pain in right ankle and joints of right foot: Secondary | ICD-10-CM | POA: Diagnosis not present

## 2021-11-09 DIAGNOSIS — M76821 Posterior tibial tendinitis, right leg: Secondary | ICD-10-CM

## 2021-11-09 NOTE — Progress Notes (Signed)
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  Patient states that she was advised years ago to obtain subtalar fusion.  Patient states the pain is gotten to the point where she cannot ambulate well.  She does have a history of cerebral palsy and spasticity.  She has been using anti-inflammatories.  Her symptoms at this time are only in the right foot.  Assessment & Plan: Visit Diagnoses:  1. Posterior tibial tendinitis, right leg   2. Pain in right ankle and joints of right foot     Plan: Discussed that we could proceed with a subtalar and talonavicular fusion.  Discussed there are risks with surgery and that other areas of her lower extremity that may be asymptomatic at this time may become symptomatic after surgery.  Patient states she understands wished to proceed at this time.  Follow-Up Instructions: Return in about 2 weeks (around 11/23/2021).   Ortho Exam  Patient is alert, oriented, no adenopathy, well-dressed, normal affect, normal respiratory effort. Examination using the Doppler patient has a strong biphasic dorsalis pedis and posterior tibial pulse no arterial insufficiency.  Patient does have an antalgic gait with weightbearing painful in the right hindfoot.  She has a positive too many toes sign worse on the right than the left with excess extreme pronation and valgus with impingement laterally.  She cannot do a single limb heel raise.  She is tender to palpation over the talonavicular and subtalar joint.  She has limited range of  motion of the ankle.  Review of her radiographs shows arthritic changes of the talonavicular joint and collapse of the subtalar joint.  Patient has pes planus.  Imaging: No results found. No images are attached to the encounter.  Labs: Lab Results  Component Value Date   HGBA1C 5.7 (H) 07/31/2020   REPTSTATUS 06/06/2020 FINAL 06/05/2020   CULT MULTIPLE SPECIES PRESENT, SUGGEST RECOLLECTION (A) 06/05/2020   LABORGA PROTEUS MIRABILIS (A) 09/27/2018     Lab Results  Component Value Date   ALBUMIN 3.7 06/15/2013    No results found for: MG No results found for: VD25OH  No results found for: PREALBUMIN CBC EXTENDED Latest Ref Rng & Units 04/16/2020 06/15/2013  WBC 3.8 - 10.8 Thousand/uL 5.6 7.8  RBC 3.80 - 5.10 Million/uL 5.50(H) 4.55  HGB 11.7 - 15.5 g/dL 43.1 54.0  HCT 08.6 - 76.1 % 43.6 36.6  PLT 140 - 400 Thousand/uL 274 280  NEUTROABS 1,500 - 7,800 cells/uL 3,349 4.6  LYMPHSABS 850 - 3,900 cells/uL 1,462 2.0     There is no height or weight on file to calculate BMI.  Orders:  No orders of the defined types were placed in this encounter.  No orders of the defined types were placed in this encounter.    Procedures: No procedures performed  Clinical Data: No additional findings.  ROS:  All other systems negative, except  as noted in the HPI. Review of Systems  Objective: Vital Signs: There were no vitals taken for this visit.  Specialty Comments:  No specialty comments available.  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   Bilateral ankle pain 07/30/2014   Acquired pes planus of right foot 06/10/2014    Acquired deformity of ankle and foot 10/01/2013   Other acquired deformities of unspecified foot 10/01/2013   Hallux valgus with bunions 07/27/2013   Other specified dermatoses 07/27/2013   Pes planus 07/27/2013   Primary osteoarthritis, unspecified ankle and foot 07/27/2013   Sebaceous cyst 07/27/2013   Skin disorder 07/27/2013   Vulvar discomfort 07/27/2013   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

## 2021-11-13 ENCOUNTER — Telehealth: Payer: Self-pay | Admitting: Family

## 2021-11-13 NOTE — Telephone Encounter (Signed)
Pt was in office 11/09/21 PTTI and is requesting her Mobic to be increased for a total of 15 mg a day and refill sent to pharm please.

## 2021-11-13 NOTE — Telephone Encounter (Signed)
Pt called requesting a refill of meloxicam. She is asking for more quantity. She takes she takes 2 a day. Please send to pharmacy on file. Pt phone number is [phone].

## 2021-11-17 ENCOUNTER — Telehealth: Payer: Self-pay | Admitting: Family

## 2021-11-17 MED ORDER — MELOXICAM 15 MG PO TABS: 15.0000 mg | ORAL_TABLET | Freq: Every day | ORAL | 2 refills | Status: DC

## 2021-11-17 NOTE — Telephone Encounter (Signed)
Patient called advised she is out of her medication. Please see previous note. The number to contact patient is [phone]

## 2021-11-17 NOTE — Telephone Encounter (Signed)
Mobic sent in by Denny Peon, NP today.

## 2021-11-17 NOTE — Addendum Note (Signed)
Addended by: Barnie Del R on: 11/17/2021 10:40 AM   Modules accepted: Orders

## 2022-03-13 ENCOUNTER — Other Ambulatory Visit: Payer: Self-pay

## 2022-03-13 ENCOUNTER — Encounter (HOSPITAL_COMMUNITY): Payer: Self-pay | Admitting: *Deleted

## 2022-03-13 ENCOUNTER — Emergency Department (HOSPITAL_COMMUNITY)
Admission: EM | Admit: 2022-03-13 | Discharge: 2022-03-13 | Disposition: A | Discharge status: Home / Self Care | Payer: Medicare Other | Attending: Emergency Medicine | Admitting: Emergency Medicine

## 2022-03-13 DIAGNOSIS — F1290 Cannabis use, unspecified, uncomplicated: Secondary | ICD-10-CM

## 2022-03-13 DIAGNOSIS — R4182 Altered mental status, unspecified: Secondary | ICD-10-CM | POA: Insufficient documentation

## 2022-03-13 DIAGNOSIS — T40721A Poisoning by synthetic cannabinoids, accidental (unintentional), initial encounter: Secondary | ICD-10-CM | POA: Diagnosis not present

## 2022-03-13 DIAGNOSIS — R400 Somnolence: Secondary | ICD-10-CM

## 2022-03-13 NOTE — ED Notes (Signed)
PT tolerated po challenge

## 2022-03-13 NOTE — Discharge Instructions (Signed)
You are seen in the ER for sleepiness. ? ?We suspect that it is because of edible ingestion. ? ?We had requested that we get some blood work completed and CT scan of your brain, you have declined blood work repeatedly. ? ?If this is edible ingestion, your symptoms should improve over time.  We recommend that someone is keeping an eye on you.  You should not be standing up to go anywhere if you are drowsy.  ? ?We request that the family member check on you in the middle the night.  If there is any change in your mental status, if you are not getting better -and have not recovered by sometime tomorrow, return to the ER immediately. ?

## 2022-03-13 NOTE — ED Provider Notes (Addendum)
?Physical Exam  ?BP 116/76 (BP Location: Right Arm)   Pulse 73   Temp (!) 97.5 ?F (36.4 ?C) (Axillary)   Resp 12   Ht 5\' 2"  (1.575 m)   Wt 79.4 kg   SpO2 97%   BMI 32.02 kg/m?  ? ?Physical Exam ? ?Procedures  ? Critical Care ?Performed by: Marland Kitchen, MD ?Authorized by: Derwood Kaplan, MD  ? ?Critical care provider statement:  ?  Critical care time (minutes):  32 ?  Critical care was necessary to treat or prevent imminent or life-threatening deterioration of the following conditions:  CNS failure or compromise ?  Critical care was time spent personally by me on the following activities:  Development of treatment plan with patient or surrogate, evaluation of patient's response to treatment, examination of patient, re-evaluation of patient's condition, review of old charts and obtaining history from patient or surrogate ? ?ED Course / MDM  ?  ?Medical Decision Making ? ?Assuming care of patient from Dr. Derwood Kaplan ?  ?Patient in the ED for : AMS. Came via EMS. She ate whole pack of edibles.  Ingestion was yesterday. ?Arousable. ? ?Concerning findings are as following : none. VSS/WNL. ? ?According to Dr. Rush Landmark, plan is to monitor closely. If she doesn't improve, then we will get labs and consider admission.  ? ?Patient had no complains, no concerns from the nursing side. Will continue to monitor. ? ?  ?Rush Landmark, MD ?03/13/22 1548 ? ?4:36 PM ?Patient reassessed.  She indicates that she took Gummies last night.  Family friend at the bedside.  Family friend states that at 11:00 when she saw the patient, patient was already confused.  She was vomiting.  She required multiple people to assist her.  Apparently, someone saw her talking normally earlier in the morning around 8 or 9 AM.  ? ?Patient remains somnolent.  She does respond appropriately to the questions asked. ? ?P.o. challenge initiated.  At 5:00 we will reassess the patient, if she is not back to baseline and family is not comfortable taking  her home then we will proceed with some blood work.  Patient confirms that she did not want any blood work done earlier.  She will consider admission and blood work if she does not feel better.  She would like to go home.  If she would like to go home, we would like to make sure there is an adult that felt comfortable taking care of her. ? ?@ 6:00  ?Patient is still somnolent.  She is answering my questions. ?P.o. challenge has been successful thus far.  There is a family friend at the bedside, she is requesting blood work.  Patient however is declining blood work.  She wants to go home.  Plan is to reassess the patient around 7 PM again. ? ?At 730: ?Patient is requesting more food.  She is still somnolent.  Same family friend is at the bedside.  Patient questioned again if she would want lab work-up, IV fluids, she continues to decline.  She is oriented to self, location, year and is able to recognize her friend.  She has no headache, chest pain, shortness of breath.  She has been ambulated with assistance and is at baseline.  Family friend was hoping that patient would agree to blood work, but patient continues to decline.  I did asked the patient to rethink her decision as, that might be reassuring for the family.  I also actually asked her to get a CT scan of  her brain.  She still declines.  Patient is moving all 4 extremities.  No nystagmus.  Pupils are 2 mm, equal.  No gross sensory abnormality with evaluation of the upper extremity and lower extremity for sensation. ? ?Plan is for patient to be discharged to family.  I informed the family friend that they need to ensure that she is getting hydration and her and that someone checks in on her in the middle the night.  They should call 911 if there is any concern or change in her mental status.  Family friend is comfortable with the plan.  She will stay with her friend.  She is also contacted the daughter, who will be available for pickup. ? ?  ?Derwood Kaplan,  MD ?03/13/22 2032 ? ?

## 2022-03-13 NOTE — ED Notes (Signed)
Pt mentation greatly improved.  Pt assisted to stretcher. ?

## 2022-03-13 NOTE — ED Triage Notes (Signed)
Pt here via [facility] from home.  Pt ate a whole pack of edibles last night (250 mg).  This am sluggish and difficult to arouse.  CBG 175.  VS stable.  Arouses with painful stimuli. ?

## 2022-03-13 NOTE — ED Provider Notes (Signed)
?[facility] ?Provider Note ? ? ?CSN: [id number] ?Arrival date & time: 03/13/22  1355 ? ?  ? ?History ? ?Chief Complaint  ?Patient presents with  ? Altered Mental Status  ? ? ?Amber Juarez is a 46 y.o. female. ? ?The history is provided by the patient and medical records. The history is limited by the condition of the patient.  ?Altered Mental Status ?Presenting symptoms: no confusion   ?Presenting symptoms comment:  Somnolence ?Severity:  Severe ?Most recent episode:  Today ?Episode history:  Continuous ?Timing:  Constant ?Progression:  Unchanged ?Chronicity:  New ?Context: drug use   ?Associated symptoms: light-headedness   ?Associated symptoms: no abdominal pain, no agitation, no fever, no headaches, no nausea, no palpitations, no rash, no seizures, no vomiting and no weakness   ? ?  ? ?Home Medications ?Prior to Admission medications   ?Medication Sig Start Date End Date Taking? Authorizing Provider  ?cephALEXin (KEFLEX) 500 MG capsule Take 1 capsule (500 mg total) by mouth 2 (two) times daily. 01/29/21   Vanessa Kick, MD  ?cholecalciferol (VITAMIN D) 25 MCG (1000 UT) tablet Take 1 tablet (1,000 Units total) by mouth daily. 03/16/19   Hilts, Legrand Como, MD  ?diclofenac (VOLTAREN) 75 MG EC tablet Take 1 tablet (75 mg total) by mouth 2 (two) times daily as needed. 04/06/21   Hilts, Legrand Como, MD  ?diphenhydrAMINE (BENADRYL) 25 MG tablet Take 2 tablets (50 mg total) by mouth every 8 (eight) hours as needed for itching. 03/04/20   Darr, Edison Nasuti, PA-C  ?EPINEPHrine 0.3 mg/0.3 mL IJ SOAJ injection Inject 0.3 mLs (0.3 mg total) into the muscle as needed for anaphylaxis. 03/04/20   Darr, Edison Nasuti, PA-C  ?famotidine (PEPCID) 20 MG tablet Take 1 tablet (20 mg total) by mouth 2 (two) times daily. ?Patient taking differently: Take 20 mg by mouth 2 (two) times daily. HAS NOT RECEIVED FROM PHARMACY 03/04/20   Darr, Edison Nasuti, PA-C  ?furosemide (LASIX) 20 MG tablet 1 PO q AM and q noon prn edema 04/01/21    Hilts, Michael, MD  ?HYDROcodone-homatropine (HYCODAN) 5-1.5 MG/5ML syrup Take 5 mLs by mouth every 6 (six) hours as needed for cough. 04/09/21   Hilts, Legrand Como, MD  ?ibuprofen (ADVIL) 800 MG tablet Take 1 tablet (800 mg total) by mouth 3 (three) times daily as needed. 04/01/21   Hilts, Legrand Como, MD  ?ketoconazole (NIZORAL) 2 % cream APPLY TO AFFECTED AREA EVERY DAY 07/10/19   [provider]  ?meloxicam (MOBIC) 15 MG tablet Take 1 tablet (15 mg total) by mouth daily. 11/17/21   Suzan Slick, NP  ?PREVIDENT 5000 BOOSTER PLUS 1.1 % PSTE  09/18/19   [provider]  ?   ? ?Allergies    ?Shellfish allergy and Bactrim [sulfamethoxazole-trimethoprim]   ? ?Review of Systems   ?Review of Systems  ?Constitutional:  Positive for fatigue. Negative for chills, diaphoresis and fever.  ?HENT:  Negative for congestion.   ?Respiratory:  Negative for cough, chest tightness, shortness of breath and wheezing.   ?Cardiovascular:  Negative for chest pain and palpitations.  ?Gastrointestinal:  Negative for abdominal pain, constipation, diarrhea, nausea and vomiting.  ?Genitourinary:  Negative for dysuria and flank pain.  ?Musculoskeletal:  Negative for back pain.  ?Skin:  Negative for rash.  ?Neurological:  Positive for light-headedness. Negative for dizziness, seizures, weakness, numbness and headaches.  ?Psychiatric/Behavioral:  Negative for agitation and confusion.   ?All other systems reviewed and are negative. ? ?Physical Exam ?Updated Vital Signs ?BP 116/76 (  BP Location: Right Arm)   Pulse 73   Temp (!) 97.5 ?F (36.4 ?C) (Axillary)   Resp 12   Ht 5\' 2"  (1.575 m)   Wt 79.4 kg   SpO2 97%   BMI 32.02 kg/m?  ?Physical Exam ?Vitals and nursing note reviewed.  ?Constitutional:   ?   General: She is not in acute distress. ?   Appearance: She is well-developed. She is not ill-appearing, toxic-appearing or diaphoretic.  ?HENT:  ?   Head: Normocephalic and atraumatic.  ?   Mouth/Throat:  ?   Mouth: Mucous membranes are  moist.  ?Eyes:  ?   Extraocular Movements: Extraocular movements intact.  ?   Conjunctiva/sclera: Conjunctivae normal.  ?   Pupils: Pupils are equal, round, and reactive to light.  ?Cardiovascular:  ?   Rate and Rhythm: Normal rate and regular rhythm.  ?   Heart sounds: No murmur heard. ?Pulmonary:  ?   Effort: Pulmonary effort is normal. No respiratory distress.  ?   Breath sounds: Normal breath sounds. No wheezing, rhonchi or rales.  ?Chest:  ?   Chest wall: No tenderness.  ?Abdominal:  ?   General: Abdomen is flat.  ?   Palpations: Abdomen is soft.  ?   Tenderness: There is no abdominal tenderness. There is no right CVA tenderness, left CVA tenderness, guarding or rebound.  ?Musculoskeletal:     ?   General: No swelling.  ?   Cervical back: Neck supple.  ?Skin: ?   General: Skin is warm and dry.  ?   Capillary Refill: Capillary refill takes less than 2 seconds.  ?Neurological:  ?   Cranial Nerves: No dysarthria or facial asymmetry.  ?   Sensory: No sensory deficit.  ?   Motor: No weakness, tremor, abnormal muscle tone or seizure activity.  ?   Comments: Somnolence but arousable  ?Psychiatric:     ?   Mood and Affect: Mood normal.  ? ? ?ED Results / Procedures / Treatments   ?Labs ?(all labs ordered are listed, but only abnormal results are displayed) ?Labs Reviewed - No data to display ? ?EKG ?EKG Interpretation ? ?Date/Time:  Saturday March 13 2022 14:08:43 EDT ?Ventricular Rate:  71 ?PR Interval:  199 ?QRS Duration: 83 ?QT Interval:  390 ?QTC Calculation: 424 ?R Axis:   64 ?Text Interpretation: Sinus rhythm Probable left atrial enlargement Borderline T abnormalities, anterior leads when compared to prior,  similar appearance with more wandering baseline. No STEMI Confirmed by Antony Blackbird [phone]) on 03/13/2022 2:29:33 PM ? ?Radiology ?No results found. ? ?Procedures ?Procedures  ? ? ?Medications Ordered in ED ?Medications - No data to display ? ?ED Course/ Medical Decision Making/ A&P ?  ?                         ?Medical Decision Making ? ? ?Amber Juarez is a 46 y.o. female with a past medical history significant for cerebral palsy, quadriplegia, previous tracheostomy, and arthritis who presents with altered mental status and accidental overdose on edibles.  According to patient, she consumed an entire pack of edibles which is reportedly 250 mg THC.  She reports has never had edibles before.  She took them last night and this morning has been altered and somnolent and difficult to arouse so patient was called in to be seen. ? ?CBG was found to be normal at 175 with EMS.  Vital signs stable during transport. ? ?Patient is  somnolent but arousable to both pain and voice.  She is answering questions appropriately when I speak with her.  She is currently denying any headache, neck pain, or any pain in her chest/abdomen/pelvis.  She denies any numbness, tingling, or weakness.  She opened her eyes and denies vision changes.  She denies nausea, vomiting, or other complaints.  She just is somnolent and difficult to wake up. ? ?She denies intent to hurt herself or any self-harm ? ?She denies any trauma and denies any previous symptoms.  She denies any fevers, chills, chest pain, shortness of breath, urinary changes, or GI symptoms.  She otherwise feels at her baseline aside from the somnolence and fatigue. ? ?I offered fluids and some labs screening however patient would rather try to metabolize the edibles before doing any invasive procedures. ? ?We will allow the patient to metabolize and reassess.  Although she denies any head injury, if she remains somnolent, patient may need CT head and labs but will hold initially. ? ?Care transferred to oncoming team to await metabolism and reassessment.  If patient is feeling better and gets closer to baseline, anticipate discharge home. ? ? ? ? ? ? ? ? ?Final Clinical Impression(s) / ED Diagnoses ?Final diagnoses:  ?Use of cannabinoid edibles  ?Somnolence  ? ?Clinical  Impression: ?1. Use of cannabinoid edibles   ?2. Somnolence   ? ? ?Disposition: Care transferred to oncoming team for reassessment ? ?This note was prepared with assistance of Systems analyst. Occasional

## 2022-07-05 ENCOUNTER — Ambulatory Visit (HOSPITAL_COMMUNITY)
Admission: EM | Admit: 2022-07-05 | Discharge: 2022-07-05 | Disposition: A | Discharge status: Home / Self Care | Payer: Medicare Other | Attending: Internal Medicine | Admitting: Internal Medicine

## 2022-07-05 ENCOUNTER — Encounter (HOSPITAL_COMMUNITY): Payer: Self-pay

## 2022-07-05 DIAGNOSIS — N3001 Acute cystitis with hematuria: Secondary | ICD-10-CM | POA: Insufficient documentation

## 2022-07-05 DIAGNOSIS — S6992XA Unspecified injury of left wrist, hand and finger(s), initial encounter: Secondary | ICD-10-CM | POA: Diagnosis present

## 2022-07-05 LAB — POCT URINALYSIS DIPSTICK, ED / UC
Bilirubin Urine: NEGATIVE
Glucose, UA: NEGATIVE mg/dL
Ketones, ur: NEGATIVE mg/dL
Nitrite: NEGATIVE
Protein, ur: NEGATIVE mg/dL
Specific Gravity, Urine: 1.02 (ref 1.005–1.030)
Urobilinogen, UA: 2 mg/dL — ABNORMAL HIGH (ref 0.0–1.0)
pH: 6 (ref 5.0–8.0)

## 2022-07-05 LAB — POC URINE PREG, ED: Preg Test, Ur: NEGATIVE

## 2022-07-05 MED ORDER — CEPHALEXIN 500 MG PO CAPS: 500.0000 mg | ORAL_CAPSULE | Freq: Three times a day (TID) | ORAL | 0 refills | Status: AC

## 2022-07-05 NOTE — ED Triage Notes (Signed)
Pt presents with urinary urgency and loss of bladder control  "for a couple of weeks". Pt states she is also 1 week late on her period.

## 2022-07-05 NOTE — Discharge Instructions (Addendum)
Your symptoms are consistent with a urinary tract infection. Start taking the antibiotic three times daily until completed. We will send out your urine culture and only call if a change in treatment is necessary. Monitor for any change in or worsening symptoms; fever, hematuria or flank pain would warrant a recheck   Please call ortho and schedule a follow up regarding your finger. Please wear the finger splint until seen in their office.

## 2022-07-06 LAB — URINE CULTURE

## 2022-07-09 NOTE — ED Provider Notes (Signed)
[facility]    CSN: [id number] Arrival date & time: 07/05/22  1216      History   Chief Complaint Chief Complaint  Patient presents with   Urinary Urgency    HPI Amber Juarez is a 46 y.o. female.   46yo female presents today with concern for recurrent dysuria off and on over the past three weeks. Denies pelvic pain, vaginal discharge, flank pain or fever. No tx tried. Is one week late on menses, however reports irregular menses. She also reports concern over immobility of her L pointer finger s/p recent injury. States it is uncomfortable, but is unable to extend the DIP joint. No tx tried.      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   Bilateral ankle pain 07/30/2014   Acquired pes planus of right foot 06/10/2014   Acquired deformity of ankle and foot 10/01/2013   Other acquired deformities of unspecified foot 10/01/2013   Hallux valgus with bunions 07/27/2013   Other specified dermatoses 07/27/2013   Pes planus 07/27/2013   Primary osteoarthritis, unspecified ankle and foot 07/27/2013   Sebaceous cyst 07/27/2013   Skin disorder 07/27/2013   Vulvar discomfort 07/27/2013   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      Home Medications    Prior to Admission medications   Medication  Sig Start Date End Date Taking? Authorizing Provider  cephALEXin (KEFLEX) 500 MG capsule Take 1 capsule (500 mg total) by mouth 3 (three) times daily for 7 days. 07/05/22 07/12/22 Yes Luana Tatro L, PA  cholecalciferol (VITAMIN D) 25 MCG (1000 UT) tablet Take 1 tablet (1,000 Units total) by mouth daily. 03/16/19   Hilts, Casimiro Needle, MD  EPINEPHrine 0.3 mg/0.3 mL IJ SOAJ injection Inject 0.3 mLs (0.3 mg total) into the muscle as needed for anaphylaxis. 03/04/20   Darr, Gerilyn Pilgrim, PA-C  famotidine (PEPCID) 20 MG tablet Take 1 tablet (20 mg total) by mouth 2 (two) times daily. Patient taking differently: Take 20 mg by mouth 2 (two) times daily. HAS NOT RECEIVED FROM PHARMACY 03/04/20   Darr, Gerilyn Pilgrim, PA-C  furosemide (LASIX) 20 MG tablet 1 PO q AM and q noon prn edema 04/01/21   Hilts, Michael, MD  ibuprofen (ADVIL) 800 MG tablet Take 1 tablet (800 mg total) by mouth 3 (three) times daily as needed. 04/01/21   Hilts, Casimiro Needle, MD  ketoconazole (NIZORAL) 2 % cream APPLY TO AFFECTED AREA EVERY DAY 07/10/19   [provider]  meloxicam (MOBIC) 15 MG tablet Take 1 tablet (15 mg total) by mouth daily. 11/17/21   Adonis Huguenin, NP  PREVIDENT 5000 BOOSTER PLUS 1.1 % PSTE  09/18/19   [provider]    Family History Family History  Problem Relation Age of Onset   Hypertension Mother  Social History Social History   Tobacco Use   Smoking status: Never   Smokeless tobacco: Never  Substance Use Topics   Alcohol use: No   Drug use: Yes    Types: Marijuana     Allergies   Shellfish allergy and Bactrim [sulfamethoxazole-trimethoprim]   Review of Systems Review of Systems  Genitourinary:  Positive for dysuria.  Musculoskeletal:  Positive for arthralgias (L pointer finger).     Physical Exam Triage Vital Signs ED Triage Vitals  Enc Vitals Group     BP 07/05/22 1400 131/89     Pulse Rate 07/05/22 1400 78     Resp 07/05/22 1400 16     Temp 07/05/22 1400 99.3 F (37.4 C)     Temp  Source 07/05/22 1400 Oral     SpO2 07/05/22 1400 99 %     Weight --      Height --      Head Circumference --      Peak Flow --      Pain Score 07/05/22 1358 0     Pain Loc --      Pain Edu? --      Excl. in GC? --    No data found.  Updated Vital Signs BP 131/89 (BP Location: Left Arm)   Pulse 78   Temp 99.3 F (37.4 C) (Oral)   Resp 16   LMP 05/27/2022   SpO2 99%   Visual Acuity Right Eye Distance:   Left Eye Distance:   Bilateral Distance:    Right Eye Near:   Left Eye Near:    Bilateral Near:     Physical Exam Vitals and nursing note reviewed.  Constitutional:      General: She is not in acute distress.    Appearance: Normal appearance. She is obese. She is not ill-appearing, toxic-appearing or diaphoretic.  HENT:     Head: Normocephalic and atraumatic.  Eyes:     Pupils: Pupils are equal, round, and reactive to light.  Cardiovascular:     Rate and Rhythm: Normal rate.     Pulses: Normal pulses.     Heart sounds: Normal heart sounds. No murmur heard.    No friction rub. No gallop.  Pulmonary:     Effort: Pulmonary effort is normal. No respiratory distress.     Breath sounds: Normal breath sounds. No wheezing or rales.  Chest:     Chest wall: No tenderness.  Abdominal:     General: Abdomen is flat. Bowel sounds are normal. There is no distension.     Palpations: Abdomen is soft. There is no mass.     Tenderness: There is no abdominal tenderness. There is no right CVA tenderness, left CVA tenderness, guarding or rebound.     Hernia: No hernia is present.  Musculoskeletal:        General: Tenderness (mild tenderness to DIP L 2nd digit with inability to extend DIP joint. No bruising or erythema) present. No swelling.     Right lower leg: No edema.     Left lower leg: No edema.  Skin:    General: Skin is warm and dry.     Findings: No bruising, erythema or rash.  Neurological:     General: No focal deficit present.     Mental Status: She is alert. Mental  status is at baseline.  Psychiatric:        Mood and Affect: Mood normal.      UC Treatments / Results  Labs (all labs ordered are listed, but only abnormal results are displayed) Labs Reviewed  URINE CULTURE  POCT URINALYSIS DIPSTICK, ED / UC - Abnormal; Notable for the following components:   Hgb urine dipstick TRACE (*)    Urobilinogen, UA 2.0 (*)    Leukocytes,Ua MODERATE (*)    All other components within normal limits  POC URINE PREG, ED    EKG   Radiology No results found.  Procedures Procedures (including critical care time)  Medications Ordered in UC Medications - No data to display  Initial Impression / Assessment and Plan / UC Course  I have reviewed the triage vital signs and the nursing notes.  Pertinent labs & imaging results that were available during my care of the patient were reviewed by me and considered in my medical decision making (see chart for details).     Acute cystitis - start PO abx as discussed. Increase water consumption. F/U with PCP if sx persist Injury to L index finger - clinically suspect tendon avulsion. Will splint in hyperflexion, pt to call and f/u with ortho.  Final Clinical Impressions(s) / UC Diagnoses   Final diagnoses:  Acute cystitis with hematuria  Injury of left index finger, initial encounter     Discharge Instructions      Your symptoms are consistent with a urinary tract infection. Start taking the antibiotic three times daily until completed. We will send out your urine culture and only call if a change in treatment is necessary. Monitor for any change in or worsening symptoms; fever, hematuria or flank pain would warrant a recheck   Please call ortho and schedule a follow up regarding your finger. Please wear the finger splint until seen in their office.    ED Prescriptions     Medication Sig Dispense Auth. Provider   cephALEXin (KEFLEX) 500 MG capsule Take 1 capsule (500 mg total) by mouth 3 (three)  times daily for 7 days. 21 capsule Helmut Hennon L, PA      PDMP not reviewed this encounter.   Maretta Bees, [place] 07/09/22 1945

## 2022-08-19 ENCOUNTER — Ambulatory Visit: Payer: Medicare Other | Admitting: Physician Assistant

## 2022-08-20 ENCOUNTER — Ambulatory Visit: Payer: Medicare Other | Admitting: Physician Assistant

## 2022-08-29 IMAGING — DX DG CHEST 2V
2 series · 2 of 2 positions shown · non-contrast
Comparison: Chest radiograph dated 02/10/2016.

CLINICAL DATA: 44-year-old female with right-sided chest pain.

EXAM:
CHEST - 2 VIEW

[chest pa]
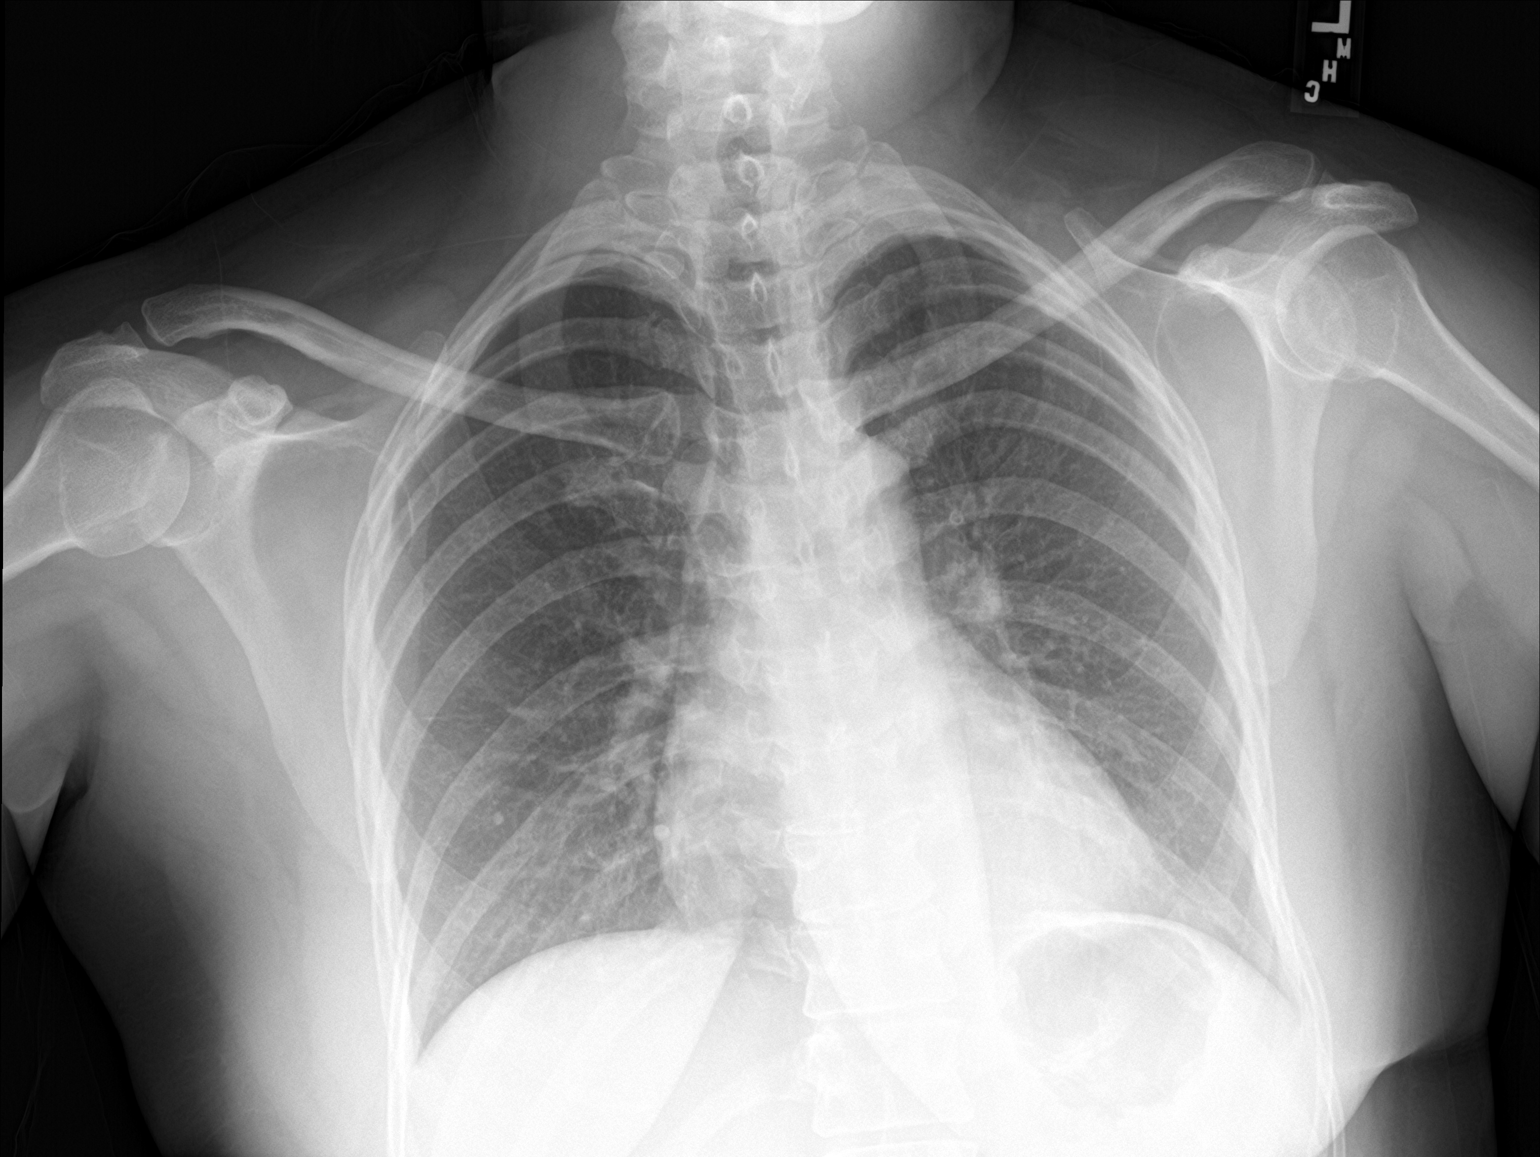

[chest lat]
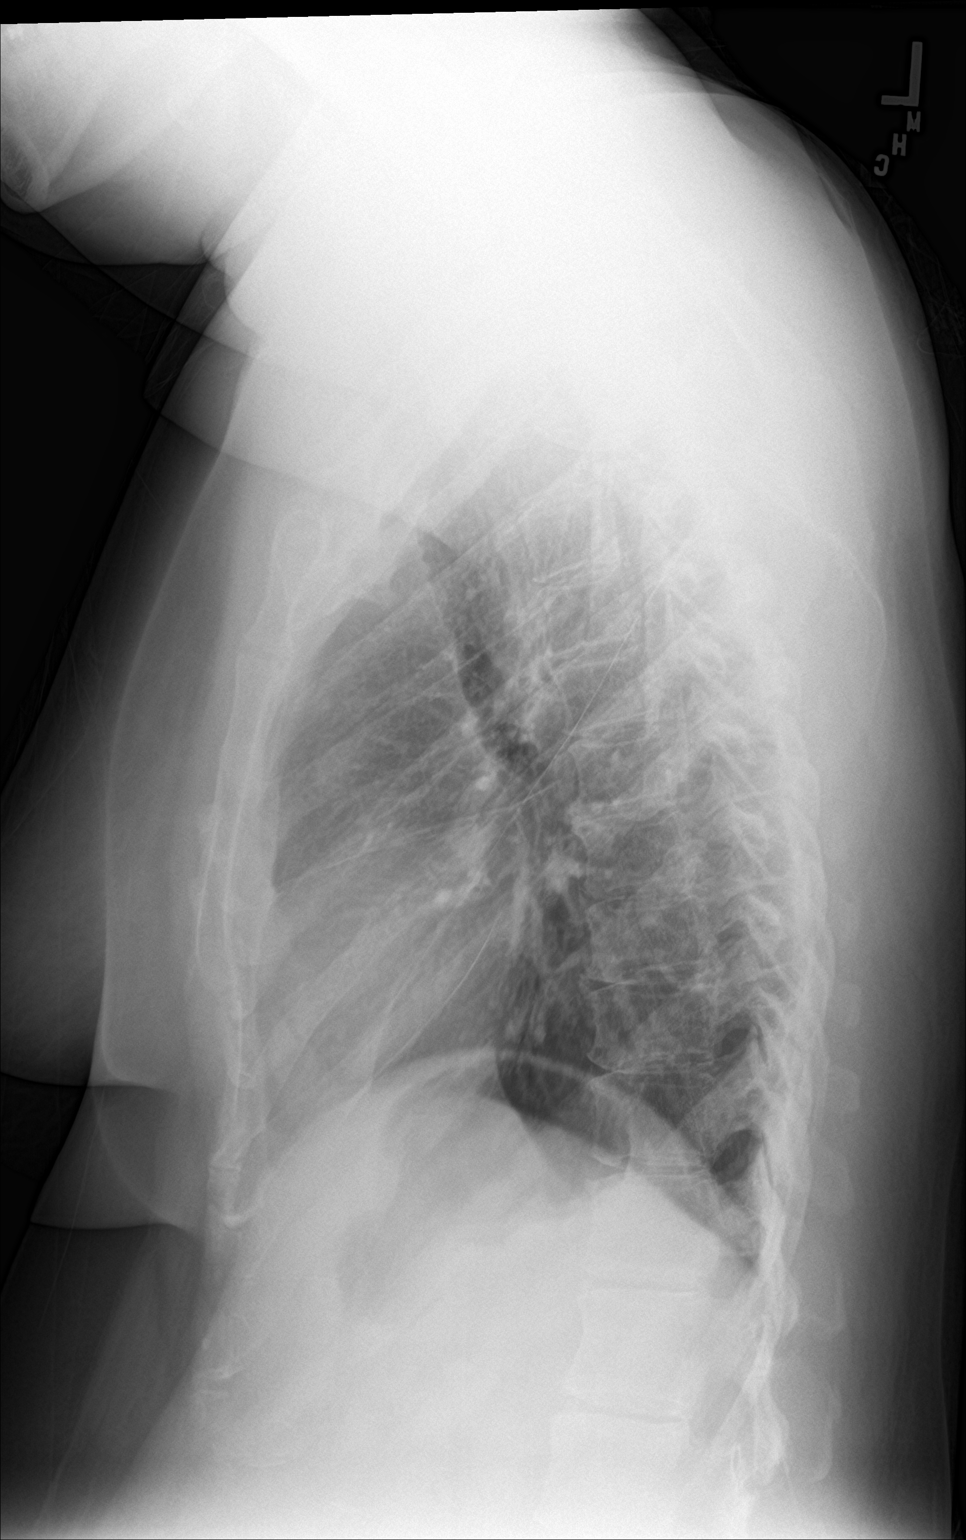

[2 of 2 positions shown; findings below may reference images not displayed]

FINDINGS: No focal consolidation, pleural effusion or pneumothorax. Probable
punctate right lower lobe calcified granuloma. The cardiac
silhouette is within limits. No acute osseous pathology.
IMPRESSION: No active cardiopulmonary disease.

## 2022-09-16 ENCOUNTER — Ambulatory Visit (HOSPITAL_COMMUNITY)
Admission: EM | Admit: 2022-09-16 | Discharge: 2022-09-16 | Disposition: A | Discharge status: Home / Self Care | Payer: Medicare Other | Attending: Urgent Care | Admitting: Urgent Care

## 2022-09-16 ENCOUNTER — Encounter (HOSPITAL_COMMUNITY): Payer: Self-pay | Admitting: *Deleted

## 2022-09-16 DIAGNOSIS — N926 Irregular menstruation, unspecified: Secondary | ICD-10-CM | POA: Diagnosis not present

## 2022-09-16 DIAGNOSIS — R051 Acute cough: Secondary | ICD-10-CM

## 2022-09-16 LAB — POC URINE PREG, ED: Preg Test, Ur: NEGATIVE

## 2022-09-16 MED ORDER — BENZONATATE 100 MG PO CAPS: 100.0000 mg | ORAL_CAPSULE | Freq: Three times a day (TID) | ORAL | 0 refills | Status: AC | PRN

## 2022-09-16 MED ORDER — DM-GUAIFENESIN ER 30-600 MG PO TB12: 1.0000 | ORAL_TABLET | Freq: Two times a day (BID) | ORAL | 0 refills | Status: AC

## 2022-09-16 NOTE — ED Triage Notes (Signed)
Pt states cough since Monday and she has a tracheostomy so she has a hard time breathing when she coughs. She took nyquil and dayquil.   She would like pregnancy test as well.

## 2022-09-16 NOTE — Discharge Instructions (Addendum)
Your vital signs are stable and your lungs are clear. I suspect you are developing a viral URI. Please start taking the Mucinex D prescribed today which to help thin the mucus. Drink plenty of water and stay adequately hydrated to also maintain thin mucus. I have sent in a cough medication to help prevent cough at night. Return to office if you develop shortness of breath, fever or if cough persists >10 days.

## 2022-09-16 NOTE — ED Provider Notes (Signed)
[facility]    CSN: [id number] Arrival date & time: 09/16/22  1548      History   Chief Complaint Chief Complaint  Patient presents with   Cough    HPI CORALYN ROSELLI is a 46 y.o. female.   Pleasant 46 year old female with a known history of cerebral palsy and a chronic tracheostomy presents today due to 2-day history of a cough.  Patient states her concern was that she was producing yellow sputum intermittently from her trach.  She denies any URI symptoms.  She denies a fever.  She denies shortness of breath or chest discomfort.  She denies palpitations.  She has not had any sick contacts, denies any COVID history.  She took over-the-counter DayQuil/NyQuil without any relief.  She does not smoke and denies any chronic respiratory issues apart from her trach. Patient is also requesting a pregnancy test.  The first day of her last normal menstrual period was suspected to be around July 22.  She denies any pregnancy symptoms.   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   Bilateral primary osteoarthritis of knee 05/30/2019   Cerebral palsy ([place]) 05/30/2019   Trichomonas vaginitis 12/28/2017   Instability of subtalar joint, right 02/11/2017   Bilateral ankle pain 07/30/2014   Acquired pes planus of right foot 06/10/2014   Acquired deformity of ankle and foot 10/01/2013   Other acquired deformities of unspecified foot 10/01/2013   Hallux valgus with bunions 07/27/2013   Other specified dermatoses 07/27/2013   Pes planus 07/27/2013   Primary osteoarthritis, unspecified ankle and foot  07/27/2013   Sebaceous cyst 07/27/2013   Skin disorder 07/27/2013   Vulvar discomfort 07/27/2013   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      Home Medications    Prior to Admission medications   Medication Sig Start Date End Date Taking? Authorizing Provider  benzonatate (TESSALON PERLES) 100 MG capsule Take 1 capsule (100 mg total) by mouth 3 (three) times daily as needed for cough. 09/16/22  Yes Lizette Pazos L, PA  dextromethorphan-guaiFENesin (MUCINEX DM) 30-600 MG 12hr tablet Take 1 tablet by mouth 2 (two) times daily. 09/16/22  Yes Bryston Colocho L, PA  EPINEPHrine 0.3 mg/0.3 mL IJ SOAJ injection Inject 0.3 mLs (0.3 mg total) into the muscle as needed for anaphylaxis. 03/04/20  Yes Darr, Edison Nasuti, PA-C  ibuprofen (ADVIL) 800 MG tablet Take 1 tablet (800 mg total) by mouth 3 (three) times daily as needed. 04/01/21  Yes Hilts, Legrand Como, MD    Family History Family History  Problem Relation Age of Onset   Hypertension Mother     Social History Social History   Tobacco Use   Smoking status: Never   Smokeless tobacco: Never  Substance Use Topics   Alcohol use: No  Drug use: Yes    Types: Marijuana     Allergies   Shellfish allergy and Bactrim [sulfamethoxazole-trimethoprim]   Review of Systems Review of Systems  Constitutional:  Negative for chills, fatigue and fever.  HENT:  Negative for congestion, rhinorrhea, sinus pressure, sinus pain, sneezing, sore throat and trouble swallowing.   Respiratory:  Positive for cough. Negative for choking, chest tightness, shortness of breath and wheezing.   Cardiovascular:  Negative for chest pain and leg swelling.  Neurological:  Negative for dizziness.  All other systems reviewed and  are negative.    Physical Exam Triage Vital Signs ED Triage Vitals  Enc Vitals Group     BP 09/16/22 1738 129/82     Pulse Rate 09/16/22 1738 92     Resp 09/16/22 1738 20     Temp 09/16/22 1738 98 F (36.7 C)     Temp Source 09/16/22 1738 Oral     SpO2 09/16/22 1738 96 %     Weight --      Height --      Head Circumference --      Peak Flow --      Pain Score 09/16/22 1736 0     Pain Loc --      Pain Edu? --      Excl. in GC? --    No data found.  Updated Vital Signs BP 129/82 (BP Location: Left Arm)   Pulse 92   Temp 98 F (36.7 C) (Oral)   Resp 20   LMP 08/17/2022 (Exact Date)   SpO2 96%   Visual Acuity Right Eye Distance:   Left Eye Distance:   Bilateral Distance:    Right Eye Near:   Left Eye Near:    Bilateral Near:     Physical Exam Vitals and nursing note reviewed. Exam conducted with a chaperone present.  Constitutional:      General: She is not in acute distress.    Appearance: Normal appearance. She is not ill-appearing, toxic-appearing or diaphoretic.  HENT:     Head: Normocephalic and atraumatic.     Right Ear: Tympanic membrane, ear canal and external ear normal. There is no impacted cerumen.     Left Ear: Tympanic membrane, ear canal and external ear normal. There is no impacted cerumen.     Nose: Nose normal. No congestion or rhinorrhea.     Mouth/Throat:     Mouth: Mucous membranes are moist.     Pharynx: Oropharynx is clear. No oropharyngeal exudate or posterior oropharyngeal erythema.  Eyes:     General: No scleral icterus.       Right eye: No discharge.        Left eye: No discharge.     Extraocular Movements: Extraocular movements intact.     Conjunctiva/sclera: Conjunctivae normal.     Pupils: Pupils are equal, round, and reactive to light.  Neck:     Comments: Patent trach Cardiovascular:     Rate and Rhythm: Normal rate and regular rhythm.     Pulses: Normal pulses.     Heart sounds: No murmur heard. Pulmonary:     Effort:  Pulmonary effort is normal. No respiratory distress.     Breath sounds: Normal breath sounds. No stridor. No wheezing, rhonchi or rales.  Chest:     Chest wall: No tenderness.  Musculoskeletal:     Cervical back: Normal range of motion and neck supple. No rigidity or tenderness.  Lymphadenopathy:     Cervical: No  cervical adenopathy.  Skin:    General: Skin is warm.     Findings: No erythema or rash.  Neurological:     General: No focal deficit present.     Mental Status: She is alert and oriented to person, place, and time.      UC Treatments / Results  Labs (all labs ordered are listed, but only abnormal results are displayed) Labs Reviewed  POC URINE PREG, ED    EKG   Radiology No results found.  Procedures Procedures (including critical care time)  Medications Ordered in UC Medications - No data to display  Initial Impression / Assessment and Plan / UC Course  I have reviewed the triage vital signs and the nursing notes.  Pertinent labs & imaging results that were available during my care of the patient were reviewed by me and considered in my medical decision making (see chart for details).     Acute cough -patient's only symptom is cough with thin yellow mucus from her trach x2 days.  She denies any lower respiratory or upper respiratory tract symptoms.  Her vital signs are stable and her exam was unremarkable.  I suspect she may be developing a viral URI.  Supportive measures appears appropriate at this time.  Will recommend Mucinex D in conjunction with benzonatate to treat her symptoms.  Encouraged adequate intake of fluids.  Return to clinic symptoms discussed. Menstrual abnormality -patient did not have a menstrual cycle in August.  Her pregnancy test today is negative.  Question perimenopause.  Patient to follow-up with her PCP for further discussion.   Final Clinical Impressions(s) / UC Diagnoses   Final diagnoses:  Acute cough  Menstrual abnormality      Discharge Instructions      Your vital signs are stable and your lungs are clear. I suspect you are developing a viral URI. Please start taking the Mucinex D prescribed today which to help thin the mucus. Drink plenty of water and stay adequately hydrated to also maintain thin mucus. I have sent in a cough medication to help prevent cough at night. Return to office if you develop shortness of breath, fever or if cough persists >10 days.     ED Prescriptions     Medication Sig Dispense Auth. Provider   dextromethorphan-guaiFENesin (MUCINEX DM) 30-600 MG 12hr tablet Take 1 tablet by mouth 2 (two) times daily. 20 tablet Zamarah Ullmer L, PA   benzonatate (TESSALON PERLES) 100 MG capsule Take 1 capsule (100 mg total) by mouth 3 (three) times daily as needed for cough. 20 capsule Jaleah Lefevre L, PA      PDMP not reviewed this encounter.   Maretta Bees, [place] 09/16/22 1921

## 2022-12-23 ENCOUNTER — Encounter: Payer: Self-pay | Admitting: Orthopaedic Surgery

## 2022-12-23 ENCOUNTER — Telehealth: Payer: Self-pay | Admitting: Radiology

## 2022-12-23 ENCOUNTER — Ambulatory Visit (INDEPENDENT_AMBULATORY_CARE_PROVIDER_SITE_OTHER): Payer: Medicare Other | Admitting: Orthopaedic Surgery

## 2022-12-23 DIAGNOSIS — M170 Bilateral primary osteoarthritis of knee: Secondary | ICD-10-CM

## 2022-12-23 NOTE — Progress Notes (Signed)
Office Visit Note   Patient: Amber Juarez           Date of Birth: Feb 25, 1976           MRN: [id number] Visit Date: 12/23/2022              Requested by: Lavada Mesi, MD [address] [place],  [place] [address] PCP: Lavada Mesi, MD   Assessment & Plan: Visit Diagnoses:  1. Primary osteoarthritis of both knees     Plan: Patient is a 46 year old woman with a history of cerebral palsy.  She is a former patient of Dr. Prince Rome.  She has a history of osteoarthritis of both of her knees.  She has gotten Synvisc 1 injections in the past that were done ultrasound-guided.  They gave her significant relief and she is able to continue walking.  She is requesting if she could have these done again.  No history of new injuries.  She did have a recent cortisone injection done at [facility] which only gave her short-term relief.  Will go forward for authorization.  Will refer her to Dr. Shon Baton as she has had her injections ultrasound-guided in the past and is comfortable with this This patient is diagnosed with osteoarthritis of the knee(s).    Radiographs show evidence of joint space narrowing, osteophytes, subchondral sclerosis and/or subchondral cysts.  This patient has knee pain which interferes with functional and activities of daily living.    This patient has experienced inadequate response, adverse effects and/or intolerance with conservative treatments such as acetaminophen, NSAIDS, topical creams, physical therapy or regular exercise, knee bracing and/or weight loss.   This patient has experienced inadequate response or has a contraindication to intra articular steroid injections for at least 3 months.   This patient is not scheduled to have a total knee replacement within 6 months of starting treatment with viscosupplementation.   Follow-Up Instructions: Return With Dr. Shon Baton for ultrasound-guided injections.   Orders:  No orders of the defined types were placed in  this encounter.  No orders of the defined types were placed in this encounter.     Procedures: No procedures performed   Clinical Data: No additional findings.   Subjective: Chief Complaint  Patient presents with   Right Knee - Pain  Patient presents with chronic right knee pain. She states that she gets cramps in her knees and it makes it difficult to walk. She is unsure if it is time for an injection. She previously had bilateral knee Synvisc One injections with Dr. Prince Rome 04/09/2021.  Per chart notes, she saw Dr. Penni Bombard at [facility] and had right knee steroid injection on 08/12/2022.  She states that the injection usually helps her knees.    Review of Systems  All other systems reviewed and are negative.    Objective: Vital Signs: There were no vitals taken for this visit.  Physical Exam Constitutional:      Appearance: Normal appearance.  Pulmonary:     Effort: Pulmonary effort is normal.  Neurological:     General: No focal deficit present.     Mental Status: She is alert.     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  She has global tenderness about both of her knees no effusion no erythema she does have positive grinding with range of motion compartments are soft and nontender Specialty Comments:  No specialty comments available.  Imaging: No results found.   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   Bilateral ankle pain 07/30/2014   Acquired pes planus of right foot 06/10/2014   Acquired deformity of ankle and foot 10/01/2013   Other acquired deformities of unspecified  foot 10/01/2013   Hallux valgus with bunions 07/27/2013   Other specified dermatoses 07/27/2013   Pes planus 07/27/2013   Primary osteoarthritis, unspecified ankle and foot 07/27/2013   Sebaceous cyst 07/27/2013   Skin disorder 07/27/2013   Vulvar discomfort 07/27/2013   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

## 2022-12-23 NOTE — Telephone Encounter (Signed)
Please obtain authorization for bilateral knee Synvisc One injections-Amber Juarez.   Patient would like for these to be done under ultrasound, the same as Dr. Prince Rome did in 2022.  Please schedule appointment with Dr. Shon Baton for the injections.

## 2022-12-28 ENCOUNTER — Telehealth: Payer: Self-pay | Admitting: Orthopaedic Surgery

## 2022-12-28 NOTE — Telephone Encounter (Signed)
Patient aware this could take weeks to get approval

## 2022-12-28 NOTE — Telephone Encounter (Signed)
Can you please notify patient waiting on auth from insurance.

## 2022-12-28 NOTE — Telephone Encounter (Signed)
Patient asking if she can schedule another appt for an injection and ir its time so her insurance will allow. Please advise..[phone]

## 2022-12-30 NOTE — Telephone Encounter (Signed)
VOB submitted for SynviscOne, bilateral knee  

## 2023-01-03 ENCOUNTER — Telehealth: Payer: Self-pay

## 2023-01-03 DIAGNOSIS — M170 Bilateral primary osteoarthritis of knee: Secondary | ICD-10-CM

## 2023-01-03 NOTE — Telephone Encounter (Signed)
Called and left a VM for patient to CB to schedule for gel injection under [place] with Dr. Rolena Infante.  Check referrals tab

## 2023-01-05 ENCOUNTER — Encounter: Payer: Self-pay | Admitting: Sports Medicine

## 2023-01-05 ENCOUNTER — Ambulatory Visit (INDEPENDENT_AMBULATORY_CARE_PROVIDER_SITE_OTHER): Payer: Medicare Other | Admitting: Sports Medicine

## 2023-01-05 ENCOUNTER — Ambulatory Visit: Payer: Self-pay

## 2023-01-05 DIAGNOSIS — M170 Bilateral primary osteoarthritis of knee: Secondary | ICD-10-CM

## 2023-01-05 DIAGNOSIS — M1712 Unilateral primary osteoarthritis, left knee: Secondary | ICD-10-CM | POA: Diagnosis not present

## 2023-01-05 DIAGNOSIS — B353 Tinea pedis: Secondary | ICD-10-CM

## 2023-01-05 DIAGNOSIS — M1711 Unilateral primary osteoarthritis, right knee: Secondary | ICD-10-CM

## 2023-01-05 MED ORDER — LIDOCAINE HCL 1 % IJ SOLN
5.0000 mL | INTRAMUSCULAR | Status: AC | PRN
  Administered 2023-01-05: 5 mL

## 2023-01-05 MED ORDER — TERBINAFINE HCL 1 % EX CREA: 1.0000 | TOPICAL_CREAM | Freq: Two times a day (BID) | CUTANEOUS | 0 refills | Status: AC

## 2023-01-05 MED ORDER — HYLAN G-F 20 48 MG/6ML IX SOSY
48.0000 mg | PREFILLED_SYRINGE | INTRA_ARTICULAR | Status: AC | PRN
  Administered 2023-01-05: 48 mg via INTRA_ARTICULAR

## 2023-01-05 NOTE — Progress Notes (Signed)
Amber Juarez - 47 y.o. female MRN [id number]  Date of birth: 1976-10-12  Office Visit Note: Visit Date: 01/05/2023 PCP: Eunice Blase, MD Referred by: Eunice Blase, MD  Subjective: Chief Complaint  Patient presents with   Left Knee - Pain   Right Knee - Pain   HPI: Amber Juarez is a pleasant 47 y.o. female who presents today for bilateral knee pain with known OA. She also would like to discuss burning between her toes.  She has a history of bilateral knee osteoarthritis.  Recently saw my partner, Haynes Dage persons who recommended Synvisc injections as she has gotten good relief from these perform ultrasound-guided in the past.  The patient does not use any prescription medicines for her pain, occasional Tylenol.  Has had intra-articular steroids with only temporary improvement.  She is also reporting at least a month or more of some burning between her toes.  Feels like she sees a fungus like substance.  This does sound like tinea pedis, she states she has had this in the past.  No significant pain.  Pertinent ROS were reviewed with the patient and found to be negative unless otherwise specified above in HPI.   Assessment & Plan: Visit Diagnoses:  1. Primary osteoarthritis of both knees   2. Tinea pedis of both feet    Plan: Discussed with Amber Juarez proceeding with her bilateral ultrasound-guided Synvisc 1 injections, she is agreeable to this.  She tolerated procedure well.  Discussed she can repeat this every 6 to 12 months as she desires.  Recommended ice and over-the-counter Tylenol for any postinjection pain. She does also have evidence of tinea pedis with some pruritus on exam.  Did send in and recommended Lamisil 1% external cream, discussed avoiding occlusive footwear, keeping the toes dry, changing socks multiple times a day if needed.  If she is not getting the results she wishes from over-the-counter cream, would recommend she follow-up with her primary care  physician for medication therapy.  Follow-up as needed.  Follow-up: With Bevely Palmer Persons, PA as needed; can repeat Synvisc q 6-12 months as needed   Meds & Orders:  Meds ordered this encounter  Medications   terbinafine (LAMISIL) 1 % cream    Sig: Apply 1 Application topically 2 (two) times daily.    Dispense:  30 g    Refill:  0    Orders Placed This Encounter  Procedures   US Guided Needle Placement - No Linked Charges     Procedures: Large Joint Inj: bilateral knee on 01/05/2023 11:47 AM Indications: pain Details: 22 G 1.5 in needle, ultrasound-guided superolateral approach Medications (Right): 5 mL lidocaine 1 %; 48 mg Hylan G-F 20 48 MG/6ML Medications (Left): 5 mL lidocaine 1 %; 48 mg Hylan G-F 20 48 MG/6ML Outcome: tolerated well, no immediate complications  Technically successful US-guided bilateral knee Synvisc-1 injections Procedure, treatment alternatives, risks and benefits explained, specific risks discussed. Consent was given by the patient. Immediately prior to procedure a time out was called to verify the correct patient, procedure, equipment, support staff and site/side marked as required. Patient was prepped and draped in the usual sterile fashion.          Clinical History: No specialty comments available.  She reports that she has never smoked. She has never used smokeless tobacco. No results for input(s): "HGBA1C", "LABURIC" in the last 8760 hours.  Objective:    Physical Exam  Gen: Well-appearing, in no acute distress; non-toxic; cerebral palsy at baseline CV:  Well-perfused. Warm.  Resp: Breathing unlabored on room air; no wheezing.  Ortho Exam - Bilateral knees: Right knee with a small joint effusion, left knee without effusion.  Range of motion from about 0 to 120 degrees.  There is some patellar crepitus bilaterally.  An obvious antalgic gait from her spastic cerebral palsy.  -Bilateral feet: There is some mild interdigital maceration between  her toes and evidence of mild fungal infection.  No significant redness surrounding the skin.  There is some moisture in between each toe more so between toes 1 and 2 and 2 and 3.  Imaging: No results found.  Past Medical/Family/Surgical/Social History: Medications & Allergies reviewed per EMR, new medications updated. 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   Bilateral ankle pain 07/30/2014   Acquired pes planus of right foot 06/10/2014   Acquired deformity of ankle and foot 10/01/2013   Other acquired deformities of unspecified foot 10/01/2013   Hallux valgus with bunions 07/27/2013   Other specified dermatoses 07/27/2013   Pes planus 07/27/2013   Primary osteoarthritis, unspecified ankle and foot 07/27/2013   Sebaceous cyst 07/27/2013   Skin disorder 07/27/2013   Vulvar discomfort 07/27/2013   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

## 2023-01-05 NOTE — Progress Notes (Signed)
Bilateral Synvisc One Injections  

## 2023-09-05 ENCOUNTER — Telehealth: Payer: Self-pay | Admitting: Sports Medicine

## 2023-09-05 NOTE — Telephone Encounter (Signed)
Pt called requesting to submit to insurance company for bil knee. synivisc one gel injection. Please call pt at [phone].

## 2023-09-07 NOTE — Telephone Encounter (Signed)
VOB submitted for Durolane, bilateral knee  

## 2023-11-02 ENCOUNTER — Telehealth: Payer: Self-pay | Admitting: Sports Medicine

## 2023-11-02 NOTE — Telephone Encounter (Signed)
Pt called requesting a call from April J. Pt states she waiting for approval for bil knee gel injections. Please call pt at [phone].

## 2023-11-03 ENCOUNTER — Other Ambulatory Visit: Payer: Self-pay

## 2023-11-03 DIAGNOSIS — M170 Bilateral primary osteoarthritis of knee: Secondary | ICD-10-CM

## 2023-11-03 NOTE — Telephone Encounter (Signed)
Talked with patient and appointment has been scheduled.  

## 2023-11-15 ENCOUNTER — Ambulatory Visit (INDEPENDENT_AMBULATORY_CARE_PROVIDER_SITE_OTHER): Payer: 59 | Admitting: Sports Medicine

## 2023-11-15 ENCOUNTER — Encounter: Payer: Self-pay | Admitting: Sports Medicine

## 2023-11-15 ENCOUNTER — Other Ambulatory Visit: Payer: Self-pay

## 2023-11-15 DIAGNOSIS — G809 Cerebral palsy, unspecified: Secondary | ICD-10-CM

## 2023-11-15 DIAGNOSIS — M7989 Other specified soft tissue disorders: Secondary | ICD-10-CM

## 2023-11-15 DIAGNOSIS — M170 Bilateral primary osteoarthritis of knee: Secondary | ICD-10-CM

## 2023-11-15 DIAGNOSIS — M25562 Pain in left knee: Secondary | ICD-10-CM

## 2023-11-15 DIAGNOSIS — M25561 Pain in right knee: Secondary | ICD-10-CM

## 2023-11-15 DIAGNOSIS — G8929 Other chronic pain: Secondary | ICD-10-CM

## 2023-11-15 MED ORDER — SODIUM HYALURONATE 60 MG/3ML IX PRSY
60.0000 mg | PREFILLED_SYRINGE | INTRA_ARTICULAR | Status: AC | PRN
  Administered 2023-11-15: 60 mg via INTRA_ARTICULAR

## 2023-11-15 MED ORDER — FUROSEMIDE 20 MG PO TABS: 20.0000 mg | ORAL_TABLET | Freq: Every day | ORAL | 0 refills | Status: AC | PRN

## 2023-11-15 MED ORDER — LIDOCAINE HCL 1 % IJ SOLN
5.0000 mL | INTRAMUSCULAR | Status: AC | PRN
  Administered 2023-11-15: 5 mL

## 2023-11-15 MED ORDER — IBUPROFEN 800 MG PO TABS: 800.0000 mg | ORAL_TABLET | Freq: Three times a day (TID) | ORAL | 1 refills | Status: AC | PRN

## 2023-11-15 NOTE — Progress Notes (Signed)
Amber Juarez - 47 y.o. female MRN [id number]  Date of birth: 1976/03/14  Office Visit Note: Visit Date: 11/15/2023 PCP: Lavada Mesi, MD Referred by: Lavada Mesi, MD  Subjective: Chief Complaint  Patient presents with   Right Knee - Pain    Injection   Left Knee - Pain    Injection   HPI: Amber Juarez is a pleasant 47 y.o. female who presents today for chronic bilateral knee pain with patellofemoral arthralgia.  Also with lower leg edema that is chronic.  History of bilateral knee osteoarthritis, most significant in the patellofemoral joint.  She has received excellent relief from Synvisc injections and these usually last her at least a year or more.  She is here for evaluation of the knees and to repeat injections under ultrasound guidance.  She does use Tylenol as well as ibuprofen only as needed, usually only once to twice weekly for breakthrough pain.  She is asking for a refill of Tylenol.  She has a history of lower extremity edema which has been followed by her prior primary care physician.  She takes Lasix 20 mg only as needed.  She is asking for a refill of this today.  Pertinent ROS were reviewed with the patient and found to be negative unless otherwise specified above in HPI.   Assessment & Plan: Visit Diagnoses:  1. Primary osteoarthritis of both knees   2. Bilateral chronic knee pain   3. Leg swelling   4. Cerebral palsy, unspecified type (HCC)    Plan: Impression is chronic bilateral knee pain with patellofemoral osteoarthritis.  She has received excellent relief for over 1 year with viscosupplementation.  Through shared decision-making, did proceed with ultrasound-guided Durolane injections into each knee today.  She may use Tylenol, ibuprofen, ice as needed for any postinjection pain.  At future visits, discussed obtaining updated x-rays of the knees to evaluate her arthritic change.  She has a history of cerebral palsy, may benefit from formal  therapy in the future.  She is also having some acute on chronic lower extremity edema, her primary care physician does give Lasix 40 to take as needed.  I am okay with doing this prescription once, but discussed with her she needs to follow-up with her primary care physician for future refills and to evaluate the etiology, which sounds like chronic venous insufficiency.  She will follow-up with me as needed, discussed the infrequent use of viscosupplementation at 6 to 49-month intervals.  *At next visit, will update X-rays of each knee  Follow-up: Return if symptoms worsen or fail to improve, for get updated x-rays of knees b/l at future visits.   Meds & Orders:  Meds ordered this encounter  Medications   furosemide (LASIX) 20 MG tablet    Sig: Take 1 tablet (20 mg total) by mouth daily as needed.    Dispense:  30 tablet    Refill:  0   ibuprofen (ADVIL) 800 MG tablet    Sig: Take 1 tablet (800 mg total) by mouth 3 (three) times daily as needed.    Dispense:  90 tablet    Refill:  1    Orders Placed This Encounter  Procedures   Large Joint Inj: R knee   Large Joint Inj: L knee   US Guided Needle Placement - No Linked Charges     Procedures: Large Joint Inj: R knee on 11/15/2023 11:05 AM Indications: pain Details: 22 G 1.5 in needle, ultrasound-guided superolateral approach Medications: 5 mL  lidocaine 1 %; 60 mg Sodium Hyaluronate 60 MG/3ML Procedure, treatment alternatives, risks and benefits explained, specific risks discussed. Consent was given by the patient. Immediately prior to procedure a time out was called to verify the correct patient, procedure, equipment, support staff and site/side marked as required. Patient was prepped and draped in the usual sterile fashion.    Large Joint Inj: L knee on 11/15/2023 11:07 AM Indications: pain Details: 22 G 1.5 in needle, ultrasound-guided superolateral approach Medications: 5 mL lidocaine 1 %; 60 mg Sodium Hyaluronate 60  MG/3ML Aspirate: clear and yellow; sent for lab analysis Outcome: tolerated well, no immediate complications  *Technically successful US-guided bilateral knee Durolane injections Procedure, treatment alternatives, risks and benefits explained, specific risks discussed. Consent was given by the patient. Immediately prior to procedure a time out was called to verify the correct patient, procedure, equipment, support staff and site/side marked as required. Patient was prepped and draped in the usual sterile fashion.          Clinical History: No specialty comments available.  She reports that she has never smoked. She has never used smokeless tobacco. No results for input(s): "HGBA1C", "LABURIC" in the last 8760 hours.  Objective:    Physical Exam  Gen: Well-appearing, in no acute distress; non-toxic CV: Well-perfused. Warm.  Resp: Breathing unlabored on room air; no wheezing. Psych: Fluid speech in conversation; appropriate affect; normal thought process Neuro: Sensation intact throughout. No gross coordination deficits.   Ortho Exam - Bilateral knees: No significant effusion, redness or swelling.  There is significant bilateral patellar crepitus.  Preserved range of motion about 0-130 degrees bilaterally.  - Legs: There is trace pitting edema of bilateral feet up to the distal shin.  Cerebral palsy which is notable in his gait.  Imaging:  07/2020: X-rays of the right knee show no sign of fracture. She has mild to  moderate degenerative spurring mostly in the lateral compartment and  patellofemoral joint. No obvious loose body.   02/2019: X-rays left knee: Mild tibiofemoral and moderate patellofemoral  degenerative changes.  No sign of loose body.    Past Medical/Family/Surgical/Social History: Medications & Allergies reviewed per EMR, new medications updated. 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   Bilateral ankle pain 07/30/2014   Acquired pes planus of right foot 06/10/2014   Acquired deformity of ankle and foot 10/01/2013   Other acquired deformities of unspecified foot 10/01/2013   Hallux valgus with bunions 07/27/2013   Other specified dermatoses 07/27/2013   Pes planus 07/27/2013   Primary osteoarthritis, unspecified ankle and foot 07/27/2013   Sebaceous cyst 07/27/2013   Skin disorder 07/27/2013   Vulvar discomfort 07/27/2013   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

## 2024-02-02 ENCOUNTER — Other Ambulatory Visit: Payer: Self-pay | Admitting: Physical Medicine and Rehabilitation

## 2024-02-02 ENCOUNTER — Ambulatory Visit
Admission: RE | Admit: 2024-02-02 | Discharge: 2024-02-02 | Disposition: A | Discharge status: Home / Self Care | Payer: 59 | Source: Ambulatory Visit | Attending: Physical Medicine and Rehabilitation | Admitting: Physical Medicine and Rehabilitation

## 2024-02-02 DIAGNOSIS — M545 Low back pain, unspecified: Secondary | ICD-10-CM

## 2024-08-03 ENCOUNTER — Telehealth: Payer: Self-pay | Admitting: Sports Medicine

## 2024-08-03 NOTE — Telephone Encounter (Signed)
 Patient was here. She would like injections in her knees.

## 2024-08-10 NOTE — Telephone Encounter (Signed)
VOB submitted for Durolane, bilateral knee  

## 2024-08-28 ENCOUNTER — Telehealth: Payer: Self-pay

## 2024-08-28 NOTE — Telephone Encounter (Signed)
 Patient scheduled durolane bilateral knee B/B no copay

## 2024-09-14 ENCOUNTER — Other Ambulatory Visit (INDEPENDENT_AMBULATORY_CARE_PROVIDER_SITE_OTHER): Payer: Self-pay

## 2024-09-14 ENCOUNTER — Other Ambulatory Visit: Payer: Self-pay

## 2024-09-14 ENCOUNTER — Ambulatory Visit: Admitting: Sports Medicine

## 2024-09-14 ENCOUNTER — Encounter: Payer: Self-pay | Admitting: Sports Medicine

## 2024-09-14 DIAGNOSIS — M7989 Other specified soft tissue disorders: Secondary | ICD-10-CM | POA: Diagnosis not present

## 2024-09-14 DIAGNOSIS — M222X2 Patellofemoral disorders, left knee: Secondary | ICD-10-CM

## 2024-09-14 DIAGNOSIS — M170 Bilateral primary osteoarthritis of knee: Secondary | ICD-10-CM | POA: Diagnosis not present

## 2024-09-14 DIAGNOSIS — M222X1 Patellofemoral disorders, right knee: Secondary | ICD-10-CM | POA: Diagnosis not present

## 2024-09-14 DIAGNOSIS — G809 Cerebral palsy, unspecified: Secondary | ICD-10-CM | POA: Diagnosis not present

## 2024-09-14 DIAGNOSIS — M1712 Unilateral primary osteoarthritis, left knee: Secondary | ICD-10-CM | POA: Diagnosis not present

## 2024-09-14 DIAGNOSIS — M1711 Unilateral primary osteoarthritis, right knee: Secondary | ICD-10-CM

## 2024-09-14 MED ORDER — LIDOCAINE HCL 1 % IJ SOLN
3.0000 mL | INTRAMUSCULAR | Status: AC | PRN
  Administered 2024-09-14: 3 mL

## 2024-09-14 MED ORDER — SODIUM HYALURONATE 60 MG/3ML IX PRSY
60.0000 mg | PREFILLED_SYRINGE | INTRA_ARTICULAR | Status: AC | PRN
  Administered 2024-09-14: 60 mg via INTRA_ARTICULAR

## 2024-09-14 MED ORDER — BUPIVACAINE HCL 0.25 % IJ SOLN
2.0000 mL | INTRAMUSCULAR | Status: AC | PRN
  Administered 2024-09-14: 2 mL via INTRA_ARTICULAR

## 2024-09-14 NOTE — Progress Notes (Signed)
 Amber Juarez - 48 y.o. female MRN [id number]  Date of birth: 01-07-1976  Office Visit Note: Visit Date: 09/14/2024 PCP: Hughie Sharper, MD Referred by: Hughie Sharper, MD  Subjective: Chief Complaint  Patient presents with   Right Knee - Pain    Injection   Left Knee - Pain    Injection   HPI: Amber Juarez is a pleasant 48 y.o. female who presents today for chronic bilateral knee pain with osteoarthritis.   Impression is chronic bilateral knee pain with osteoarthritis, most notable in the patellofemoral joints.  She has been doing quite well and has received good relief which has been long-lasting from previous viscosupplementation.  She would like to move forward with Durolane injections today.  She also mentions she will get swelling in the feet and ankles, this is worse at the end of the day.  She has tried compression socks/stockings in the past but did not like these.  She does use Lasix  as needed.  Pertinent ROS were reviewed with the patient and found to be negative unless otherwise specified above in HPI.   Assessment & Plan: Visit Diagnoses:  1. Primary osteoarthritis of both knees   2. Patellofemoral arthralgia of both knees   3. Leg swelling   4. Cerebral palsy, unspecified type (HCC)    Plan: Impression is chronic bilateral knee pain with arthritic changes most notable in the patellofemoral region.  We did repeat x-rays today and did compare them to her previous x-rays in 2021, has had mild progression of the right knee but the left knee has stayed relatively stable.  Most notable patellofemoral arthritic change, no acute bony abnormalities noted.  She has received good relief from viscosupplementation in the past, we did repeat Durolane injections under ultrasound guidance today.  Advised on postinjection protocol.  She will continue remaining active to help offload the knees, she does have underlying CP so activity modifications may be made.  She is dealing  with lower extremity pedal edema that is worse at the end of the day.  She has taken Lasix  20 mg as needed from her previous PCP and I did refill that for her last year.  She does have resolution of her swelling with this.  She may continue taking Lasix  as needed.  I did review previous echocardiogram which showed no valvular dysfunction and preserved ejection fraction.  Discussed with Steen this is most likely indicative of venous insufficiency, would recommend venous Doppler ultrasound in the future, she may pursue this via her PCP if interested.  I will follow-up with her on an as-needed basis.  Follow-up: Return if symptoms worsen or fail to improve.   Meds & Orders: No orders of the defined types were placed in this encounter.   Orders Placed This Encounter  Procedures   Large Joint Inj: R knee   Large Joint Inj: L knee   XR Knee Complete 4 Views Right   XR Knee Complete 4 Views Left   US  Guided Needle Placement - No Linked Charges     Procedures: Large Joint Inj: R knee on 09/14/2024 1:39 PM Indications: pain Details: 22 G 1.5 in needle, ultrasound-guided superolateral approach Medications: 3 mL lidocaine  1 %; 2 mL bupivacaine  0.25 %; 60 mg Sodium Hyaluronate 60 MG/3ML Outcome: tolerated well, no immediate complications  *Procedurally successful ultrasound-guided right intra-articular injection, Durolane Procedure, treatment alternatives, risks and benefits explained, specific risks discussed. Consent was given by the patient. Patient was prepped and draped in the usual  sterile fashion.    Large Joint Inj: L knee on 09/14/2024 1:41 PM Indications: pain Details: 22 G 1.5 in needle, ultrasound-guided superolateral approach Medications: 3 mL lidocaine  1 %; 2 mL bupivacaine  0.25 %; 60 mg Sodium Hyaluronate 60 MG/3ML Outcome: tolerated well, no immediate complications  *Procedurally successful ultrasound-guided left intra-articular injection, Durolane Procedure, treatment  alternatives, risks and benefits explained, specific risks discussed. Consent was given by the patient. Patient was prepped and draped in the usual sterile fashion.          Clinical History: No specialty comments available.  She reports that she has never smoked. She has never used smokeless tobacco. No results for input(s): HGBA1C, LABURIC in the last 8760 hours.  Objective:    Physical Exam  Gen: Well-appearing, in no acute distress; non-toxic CV: Well-perfused. Warm.  Resp: Breathing unlabored on room air; no wheezing. Psych: Fluid speech in conversation; appropriate affect; normal thought process  Ortho Exam - Bilateral knees: No significant effusion of either knee, there is positive patellar Clark and grind test.  There is significant patellofemoral crepitus.  Range of motion is 0-125 as of the right, 0-130 degrees of the left.  Patient does have AP indicative of her underlying CP.  No significant pedal edema noted on examination today.  Imaging: XR Knee Complete 4 Views Right Result Date: 09/14/2024 4 views of bilateral knees including standing AP, Rosenberg, lateral and sunrise views were ordered and reviewed by myself today.  X-rays demonstrate mild right knee medial tibiofemoral arthritic change, the left knee is relatively well-preserved in the weightbearing surfaces.  There is mild to moderate patellofemoral arthralgia on the left and at least moderate patellofemoral arthritic change on the right knee with notable osteophytosis.  No acute fracture or otherwise acute bony abnormality noted.  XR Knee Complete 4 Views Left Result Date: 09/14/2024 4 views of bilateral knees including standing AP, Rosenberg, lateral and sunrise views were ordered and reviewed by myself today.  X-rays demonstrate mild right knee medial tibiofemoral arthritic change, the left knee is relatively well-preserved in the weightbearing surfaces.  There is mild to moderate patellofemoral arthralgia on  the left and at least moderate patellofemoral arthritic change on the right knee with notable osteophytosis.  No acute fracture or otherwise acute bony abnormality noted.      ECHOCARDIOGRAM REPORT       Patient Name:   Amber Juarez Date of Exam: 06/04/2020  Medical Rec #:  [id number]           Height:       62.0 in  Accession #:    [id number]          Weight:       155.0 lb  Date of Birth:  March 01, 1976            BSA:          1.715 m  Patient Age:    44 years            BP:           130/68 mmHg  Patient Gender: F                   HR:           66 bpm.  Exam Location:  [address]   Procedure: 2D Echo, Cardiac Doppler, Color Doppler and Intracardiac             Opacification Agent   Indications:    R60.0  Leg edema    History:        Patient has no prior history of Echocardiogram  examinations.    Sonographer:   Marshia Rea RAMAN, RDCS  Referring Phys: [id number] MICHAEL HILTS     Sonographer Comments: Suboptimal apical window.  IMPRESSIONS     1. Left ventricular ejection fraction, by estimation, is 60 to 65%. The  left ventricle has normal function. The left ventricle has no regional  wall motion abnormalities. Left ventricular diastolic parameters were  normal.   2. Right ventricular systolic function is normal. The right ventricular  size is normal.   3. The mitral valve is myxomatous. Trivial mitral valve regurgitation. No  evidence of mitral stenosis.   4. The aortic valve is normal in structure. Aortic valve regurgitation is  not visualized. No aortic stenosis is present.   5. The inferior vena cava is normal in size with greater than 50%  respiratory variability, suggesting right atrial pressure of 3 mmHg.   FINDINGS   Left Ventricle: Left ventricular ejection fraction, by estimation, is 60  to 65%. The left ventricle has normal function. The left ventricle has no  regional wall motion abnormalities. Definity  contrast agent was given IV  to delineate the left  ventricular   endocardial borders. The left ventricular internal cavity size was normal  in size. There is no left ventricular hypertrophy. Left ventricular  diastolic parameters were normal.   Right Ventricle: The right ventricular size is normal. No increase in  right ventricular wall thickness. Right ventricular systolic function is  normal.   Left Atrium: Left atrial size was normal in size.   Right Atrium: Right atrial size was normal in size.   Pericardium: There is no evidence of pericardial effusion.   Mitral Valve: The mitral valve is myxomatous. There is moderate  holosystolic prolapse of multiple segments of the anterior leaflet of the  mitral valve. Normal mobility of the mitral valve leaflets. Trivial mitral  valve regurgitation. No evidence of  mitral valve stenosis.   Tricuspid Valve: The tricuspid valve is normal in structure. Tricuspid  valve regurgitation is not demonstrated. No evidence of tricuspid  stenosis.   Aortic Valve: The aortic valve is normal in structure. Aortic valve  regurgitation is not visualized. No aortic stenosis is present.   Pulmonic Valve: The pulmonic valve was normal in structure. Pulmonic valve  regurgitation is not visualized. No evidence of pulmonic stenosis.   Aorta: The aortic root is normal in size and structure.   Venous: The inferior vena cava is normal in size with greater than 50%  respiratory variability, suggesting right atrial pressure of 3 mmHg.   IAS/Shunts: No atrial level shunt detected by color flow Doppler.     LEFT VENTRICLE  PLAX 2D  LVIDd:         3.90 cm  Diastology  LVIDs:         2.70 cm  LV e' lateral:   8.16 cm/s  LV PW:         0.90 cm  LV E/e' lateral: 13.7  LV IVS:        0.70 cm  LV e' medial:    8.59 cm/s  LVOT diam:     2.00 cm  LV E/e' medial:  13.0  LV SV:         62  LV SV Index:   36  LVOT Area:     3.14 cm     RIGHT VENTRICLE  RV Basal  diam:  2.90 cm  RV S prime:     10.20 cm/s   TAPSE (M-mode): 1.8 cm   LEFT ATRIUM             Index       RIGHT ATRIUM           Index  LA diam:        3.10 cm 1.81 cm/m  RA Pressure: 3.00 mmHg  LA Vol (A2C):   20.9 ml 12.18 ml/m RA Area:     12.30 cm  LA Vol (A4C):   27.8 ml 16.21 ml/m RA Volume:   26.90 ml  15.68 ml/m  LA Biplane Vol: 24.2 ml 14.11 ml/m   AORTIC VALVE  LVOT Vmax:   93.10 cm/s  LVOT Vmean:  62.800 cm/s  LVOT VTI:    0.198 m    AORTA  Ao Root diam: 2.60 cm  Ao Asc diam:  2.50 cm   MITRAL VALVE                TRICUSPID VALVE                              Estimated RAP:  3.00 mmHg    MV E velocity: 112.00 cm/s  SHUNTS  MV A velocity: 92.20 cm/s   Systemic VTI:  0.20 m  MV E/A ratio:  1.21         Systemic Diam: 2.00 cm   Oneil Parchment MD  Electronically signed by Oneil Parchment MD  Signature Date/Time: 06/04/2020/1:56:38 PM        Final     Past Medical/Family/Surgical/Social History: Medications & Allergies reviewed per EMR, new medications updated. 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   Bilateral ankle pain 07/30/2014   Acquired pes planus of right foot 06/10/2014   Acquired deformity of ankle and foot 10/01/2013   Other acquired deformities of unspecified foot 10/01/2013   Hallux valgus with bunions 07/27/2013   Other specified dermatoses 07/27/2013   Pes planus 07/27/2013   Primary osteoarthritis, unspecified ankle and foot 07/27/2013   Sebaceous cyst 07/27/2013   Skin disorder 07/27/2013   Vulvar discomfort 07/27/2013   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

## 2024-10-31 ENCOUNTER — Ambulatory Visit (INDEPENDENT_AMBULATORY_CARE_PROVIDER_SITE_OTHER)

## 2024-10-31 ENCOUNTER — Ambulatory Visit (HOSPITAL_COMMUNITY)
Admission: EM | Admit: 2024-10-31 | Discharge: 2024-10-31 | Disposition: A | Discharge status: Home / Self Care | Attending: Physician Assistant | Admitting: Physician Assistant

## 2024-10-31 ENCOUNTER — Encounter (HOSPITAL_COMMUNITY): Payer: Self-pay | Admitting: *Deleted

## 2024-10-31 ENCOUNTER — Other Ambulatory Visit: Payer: Self-pay

## 2024-10-31 DIAGNOSIS — R8290 Unspecified abnormal findings in urine: Secondary | ICD-10-CM | POA: Diagnosis not present

## 2024-10-31 DIAGNOSIS — K5900 Constipation, unspecified: Secondary | ICD-10-CM | POA: Insufficient documentation

## 2024-10-31 DIAGNOSIS — N898 Other specified noninflammatory disorders of vagina: Secondary | ICD-10-CM | POA: Diagnosis not present

## 2024-10-31 DIAGNOSIS — R109 Unspecified abdominal pain: Secondary | ICD-10-CM | POA: Diagnosis not present

## 2024-10-31 LAB — POCT URINE DIPSTICK
Bilirubin, UA: NEGATIVE
Blood, UA: NEGATIVE
Glucose, UA: NEGATIVE mg/dL
Ketones, POC UA: NEGATIVE mg/dL
Nitrite, UA: NEGATIVE
POC PROTEIN,UA: NEGATIVE
Spec Grav, UA: 1.015 (ref 1.010–1.025)
Urobilinogen, UA: 2 U/dL — AB
pH, UA: 7 (ref 5.0–8.0)

## 2024-10-31 MED ORDER — POLYETHYLENE GLYCOL 3350 17 G PO PACK: 17.0000 g | PACK | Freq: Every day | ORAL | 0 refills | Status: AC

## 2024-10-31 NOTE — ED Provider Notes (Signed)
 [facility]    CSN: [id number] Arrival date & time: 10/31/24  1119      History   Chief Complaint Chief Complaint  Patient presents with   Headache   Flank Pain    HPI Amber Juarez is a 48 y.o. female.   Patient presents today with 24-hour history of right sided abdominal discomfort.  She has also had a mild headache but denies additional symptoms including frequency, urgency, hematuria, flank pain, back pain, body aches, fever, nausea, vomiting.  She denies any change in activity or recent injury.  She has not been taking any over-the-counter medication for symptom management.  She does report that she has not been having bowel movements regularly which is her normal but then took a laxative yesterday.  After having a very large bowel movement she did feel better but continues to have some discomfort.  She denies history of diverticulitis/diverticulosis but does not believe that she has ever had a colonoscopy.  She does have a remote history of nephrolithiasis but states current symptoms are not similar to previous episodes of this condition and she has not had any difficulty with this condition in the past several years.  Symptoms are worse with activity or certain movement.  She denies any melena, hematochezia, weakness.  Denies previous abdominal surgeries but still has her appendix.  She has had some vaginal itching and is interested in testing for yeast versus BV but has no concern for STI as she has not been sexually active recently.  She declined testing for these conditions.    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   Bilateral ankle pain 07/30/2014   Acquired pes planus of right foot 06/10/2014   Acquired deformity of ankle and foot 10/01/2013   Other acquired deformities of unspecified foot 10/01/2013   Hallux valgus with bunions 07/27/2013   Other specified dermatoses 07/27/2013   Pes planus 07/27/2013   Primary osteoarthritis, unspecified ankle and foot 07/27/2013   Sebaceous cyst 07/27/2013   Skin disorder 07/27/2013   Vulvar discomfort 07/27/2013   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      Home Medications    Prior to Admission medications   Medication Sig Start Date End Date Taking? Authorizing Provider  furosemide  (LASIX ) 20 MG tablet Take 1 tablet (20 mg total) by mouth daily as needed. 11/15/23  Yes Brooks, Dana, DO  ibuprofen  (ADVIL ) 800 MG tablet Take 1 tablet (800 mg total) by mouth 3 (three) times daily as needed. 11/15/23  Yes Brooks, Dana, DO  polyethylene glycol (MIRALAX ) 17 g packet Take 17 g by mouth daily. 10/31/24  Yes Quy Lotts K, PA-C  terbinafine  (LAMISIL ) 1 % cream Apply 1 Application topically 2 (two) times daily. 01/05/23  Yes Brooks, Dana, DO  benzonatate  (  TESSALON  PERLES) 100 MG capsule Take 1 capsule (100 mg total) by mouth 3 (three) times daily as needed for cough. Patient not taking: Reported on 12/23/2022 09/16/22   Crain, Whitney L, PA  dextromethorphan-guaiFENesin  (MUCINEX  DM) 30-600 MG 12hr tablet Take 1 tablet by mouth 2 (two) times daily. Patient not taking: Reported on 12/23/2022 09/16/22   Crain, Whitney L, PA  EPINEPHrine  0.3 mg/0.3 mL IJ SOAJ injection Inject 0.3 mLs (0.3 mg total) into the muscle as needed for anaphylaxis. 03/04/20    Darr, Jacob, PA-C  gabapentin (NEURONTIN) 300 MG capsule Take 300 mg by mouth 3 (three) times daily.    [provider]  ketoconazole (NIZORAL) 2 % cream Apply topically.    [provider]  oxybutynin (DITROPAN-XL) 5 MG 24 hr tablet Take 5 mg by mouth daily.    [provider]    Family History Family History  Problem Relation Age of Onset   Hypertension Mother     Social History Social History   Tobacco Use   Smoking status: Never   Smokeless tobacco: Never  Substance Use Topics   Alcohol use: No   Drug use: Yes    Types: Marijuana     Allergies   Shellfish allergy and Bactrim  [sulfamethoxazole -trimethoprim ]   Review of Systems Review of Systems  Constitutional:  Positive for activity change. Negative for appetite change, fatigue and fever.  HENT:  Negative for congestion and sore throat.   Respiratory:  Negative for shortness of breath.   Cardiovascular:  Negative for chest pain.  Gastrointestinal:  Positive for abdominal pain and constipation. Negative for diarrhea, nausea and vomiting.  Musculoskeletal:  Negative for arthralgias and myalgias.  Neurological:  Positive for headaches. Negative for dizziness and light-headedness.     Physical Exam Triage Vital Signs ED Triage Vitals  Encounter Vitals Group     BP 10/31/24 1145 98/64     Girls Systolic BP Percentile --      Girls Diastolic BP Percentile --      Boys Systolic BP Percentile --      Boys Diastolic BP Percentile --      Pulse Rate 10/31/24 1145 87     Resp 10/31/24 1145 18     Temp 10/31/24 1145 98 F (36.7 C)     Temp src --      SpO2 10/31/24 1145 94 %     Weight --      Height --      Head Circumference --      Peak Flow --      Pain Score 10/31/24 1141 10     Pain Loc --      Pain Education --      Exclude from Growth Chart --    No data found.  Updated Vital Signs BP 98/64   Pulse 87   Temp 98 F (36.7 C)   Resp 18   LMP 10/31/2024 (Approximate)  Comment: PT reports irregular  SpO2 94%   Visual Acuity Right Eye Distance:   Left Eye Distance:   Bilateral Distance:    Right Eye Near:   Left Eye Near:    Bilateral Near:     Physical Exam Vitals reviewed.  Constitutional:      General: She is awake. She is not in acute distress.    Appearance: Normal appearance. She is well-developed. She is not ill-appearing.     Comments: Very pleasant female appears stated age in no acute distress sitting comfortably in  exam room  HENT:     Head: Normocephalic and atraumatic.  Cardiovascular:     Rate and Rhythm: Normal rate and regular rhythm.     Heart sounds: Normal heart sounds, S1 normal and S2 normal. No murmur heard. Pulmonary:     Effort: Pulmonary effort is normal.     Breath sounds: Normal breath sounds. No wheezing, rhonchi or rales.     Comments: Clear to auscultation bilaterally Abdominal:     General: Bowel sounds are normal.     Palpations: Abdomen is soft.     Tenderness: There is abdominal tenderness in the right upper quadrant and right lower quadrant. There is no right CVA tenderness, left CVA tenderness, guarding or rebound. Negative signs include Murphy's sign, Rovsing's sign, McBurney's sign, psoas sign and obturator sign.     Comments: Mild tenderness to palpation along right abdomen without evidence of acute abdomen.  No CVA tenderness.  Musculoskeletal:     Cervical back: No tenderness or bony tenderness.     Thoracic back: No tenderness or bony tenderness.     Lumbar back: No tenderness or bony tenderness.  Psychiatric:        Behavior: Behavior is cooperative.      UC Treatments / Results  Labs (all labs ordered are listed, but only abnormal results are displayed) Labs Reviewed  POCT URINE DIPSTICK - Abnormal; Notable for the following components:      Result Value   Urobilinogen, UA 2.0 (*)    Leukocytes, UA Trace (*)    All other components within normal limits  URINE CULTURE  CERVICOVAGINAL  ANCILLARY ONLY    EKG   Radiology DG Abdomen 1 View Result Date: 10/31/2024 CLINICAL DATA:  Right-sided abdominal pain. EXAM: ABDOMEN - 1 VIEW COMPARISON:  02/02/2024. FINDINGS: Mild gaseous prominence of the colon. Stool is seen predominantly in the ascending and rectosigmoid. No small bowel dilatation. IMPRESSION: Mild to moderate stool burden.  No acute findings. Electronically Signed   By: Newell Eke M.D.   On: 10/31/2024 13:24    Procedures Procedures (including critical care time)  Medications Ordered in UC Medications - No data to display  Initial Impression / Assessment and Plan / UC Course  I have reviewed the triage vital signs and the nursing notes.  Pertinent labs & imaging results that were available during my care of the patient were reviewed by me and considered in my medical decision making (see chart for details).     Patient is well-appearing, afebrile, nontoxic, nontachycardic.  Vital signs and physical exam are reassuring with no indication for emergent evaluation or imaging.  UA was obtained that showed trace leukocytes but was otherwise normal.  Will send this for culture but defer antibiotics until culture results are available.  Patient did report some vaginal itching and so vaginal swab for BV and yeast was obtained and is pending.  She declined testing for STI.  X-ray was obtained that showed moderate stool burden particularly in ascending colon we discussed this is likely contributing to her symptoms.  She did have improvement after taking a laxative at home so we will start MiraLAX  daily for the next week.  Recommend that she push fluids and increase the fiber in her diet.  If she does not have improvement with this medication within a few days or if anything worsens and she has severe abdominal pain, focal abdominal pain, fever, melena, hematochezia, nausea/vomiting she needs to be seen emergently.  Strict return precautions given.  All questions answered to  patient satisfaction.  Final Clinical Impressions(s) / UC Diagnoses   Final diagnoses:  Right sided abdominal pain  Constipation, unspecified constipation type  Abnormal urinalysis  Vaginal itching     Discharge Instructions      Your urine had a little bit of white blood cells but otherwise no evidence of an infection.  I will contact you if your urine culture or swab are abnormal and change our treatment plan.  Your x-ray did show that you had constipation with a significant amount of stool in the right side of your abdomen.  I think this is contributing to your symptoms.  Start MiraLAX  daily.  Make sure you are drinking plenty of fluid.  I also recommend increasing the amount of fiber in your diet.  If you have any worsening symptoms including severe abdominal pain, fever, nausea/vomiting, blood in your stool, blood in vomit you need to go to the emergency room immediately.     ED Prescriptions     Medication Sig Dispense Auth. Provider   polyethylene glycol (MIRALAX ) 17 g packet Take 17 g by mouth daily. 14 each Lizandra Zakrzewski K, PA-C      PDMP not reviewed this encounter.   Sherrell Rocky POUR, PA-C 10/31/24 1422

## 2024-10-31 NOTE — ED Notes (Signed)
 Pt sipping on water to try to be able to void.

## 2024-10-31 NOTE — Discharge Instructions (Signed)
 Your urine had a little bit of white blood cells but otherwise no evidence of an infection.  I will contact you if your urine culture or swab are abnormal and change our treatment plan.  Your x-ray did show that you had constipation with a significant amount of stool in the right side of your abdomen.  I think this is contributing to your symptoms.  Start MiraLAX  daily.  Make sure you are drinking plenty of fluid.  I also recommend increasing the amount of fiber in your diet.  If you have any worsening symptoms including severe abdominal pain, fever, nausea/vomiting, blood in your stool, blood in vomit you need to go to the emergency room immediately.

## 2024-10-31 NOTE — ED Triage Notes (Signed)
 PT reports having a HA and rt sided pain since yesterday. PT denies any injury .

## 2024-11-01 LAB — CERVICOVAGINAL ANCILLARY ONLY
Bacterial Vaginitis (gardnerella): POSITIVE — AB
Candida Glabrata: NEGATIVE
Candida Vaginitis: POSITIVE — AB
Comment: NEGATIVE
Comment: NEGATIVE
Comment: NEGATIVE

## 2024-11-02 ENCOUNTER — Ambulatory Visit: Payer: Self-pay

## 2024-11-02 LAB — URINE CULTURE

## 2024-11-02 MED ORDER — FLUCONAZOLE 150 MG PO TABS: 150.0000 mg | ORAL_TABLET | Freq: Once | ORAL | 0 refills | Status: AC

## 2024-11-02 MED ORDER — METRONIDAZOLE 500 MG PO TABS: 500.0000 mg | ORAL_TABLET | Freq: Two times a day (BID) | ORAL | 0 refills | Status: AC

## 2024-11-08 MED ORDER — FLUCONAZOLE 150 MG PO TABS: 150.0000 mg | ORAL_TABLET | Freq: Once | ORAL | 0 refills | Status: AC

## 2024-11-08 NOTE — Telephone Encounter (Signed)
 Pt LVM. Returned pt's call. Advised of results and meds. Pt reports she picked up flagyl , but diflucan  was not at pharmacy. Resent. Questions answered. Verbalized understanding.

## 2025-01-08 ENCOUNTER — Encounter: Payer: Self-pay | Admitting: Sports Medicine

## 2025-01-08 ENCOUNTER — Ambulatory Visit: Admitting: Sports Medicine

## 2025-01-08 DIAGNOSIS — G808 Other cerebral palsy: Secondary | ICD-10-CM

## 2025-01-08 DIAGNOSIS — R269 Unspecified abnormalities of gait and mobility: Secondary | ICD-10-CM | POA: Diagnosis not present

## 2025-01-08 DIAGNOSIS — M7989 Other specified soft tissue disorders: Secondary | ICD-10-CM

## 2025-01-08 DIAGNOSIS — M170 Bilateral primary osteoarthritis of knee: Secondary | ICD-10-CM | POA: Diagnosis not present

## 2025-01-08 DIAGNOSIS — G809 Cerebral palsy, unspecified: Secondary | ICD-10-CM | POA: Diagnosis not present

## 2025-01-08 NOTE — Progress Notes (Signed)
 "  Amber Juarez - 49 y.o. female MRN [id number]  Date of birth: 1976/05/03  Office Visit Note: Visit Date: 01/08/2025 PCP: Hughie Sharper, MD Referred by: Hughie Sharper, MD  Subjective: Chief Complaint  Patient presents with   Right Leg - Pain   Left Leg - Pain   HPI: Amber Juarez is a pleasant 49 y.o. female who presents today for acute on chronic bilateral leg pain/knee pain, swelling.  Discussed the use of AI scribe software for clinical note transcription with the patient, who gave verbal consent to proceed.  History of Present Illness Amber Juarez is a 49 year old female with cerebral palsy and bilateral knee osteoarthritis who presents with worsening bilateral knee pain and reports of leg swelling.  Over the past week she has had increased bilateral knee pain with instability and now needs assistance for mobility. She had a recent fall related to knee instability and is concerned about safety.  She has recurrent swelling in both lower extremities, worst from ankles to feet, with fluctuations in severity. Edema is less today but she still has difficulty walking. She takes furosemide  for fluid management by primary physician.  She has chronic bilateral knee pain and patellofemoral arthralgia and received intra-articular gel injections to both knees in September 2025, with uncertain benefit. Ongoing pain and swelling have markedly limited her mobility and independence.  *Patient did call her phone on the front today to describe her pain and swelling in the legs when this most recently flared.  *Previously underwent [place] ultrasound-guided injections for the right and left knee on 09/14/2024.  Pertinent ROS were reviewed with the patient and found to be negative unless otherwise specified above in HPI.   Assessment & Plan: Visit Diagnoses:  1. Gait difficulty   2. Cerebral palsy, unspecified type (HCC)   3. Congenital quadriplegia (HCC)   4. Primary  osteoarthritis of both knees   5. Leg swelling     Assessment & Plan Primary osteoarthritis of both knees Chronic bilateral knee osteoarthritis, worse in patellofemoral joint with pain and functional limitation. Previous viscosupplementation unclear benefit. - Consider ultrasound-guided corticosteroid injections for both knees. - Prescribed rolling walker, prescription script provided today - referral to PT  Patellofemoral arthralgia of both knees Persistent patellofemoral pain with crepitus and mechanical symptoms affecting gait. - Consider ultrasound-guided corticosteroid injections for both knees. - Recommended physical therapy for strengthening and gait training.  Cerebral palsy Cerebral palsy affecting gait and ambulation. - Recommended referral to specialized physical therapist for gait and mobility training, neurorehab  Lower extremity swelling Recurrent bilateral lower extremity swelling, likely venous in origin, unrelated to orthopedic pathology. - Advised follow-up with primary care provider for edema evaluation and diuretic therapy adjustment. - continue Lasix  20mg  every day PRN, may need adjustment - but defer to primary - compression stockings, leg elevation recommended   Follow-up: Few weeks to re-eval knees (consider injs), f/u from PT  Meds & Orders: No orders of the defined types were placed in this encounter.   Orders Placed This Encounter  Procedures   Ambulatory referral to Physical Therapy     Procedures: No procedures performed      Clinical History: No specialty comments available.  She reports that she has never smoked. She has never used smokeless tobacco. No results for input(s): HGBA1C, LABURIC in the last 8760 hours.  Objective:    Physical Exam  Gen: Well-appearing, in no acute distress; non-toxic CV: Well-perfused. Warm.  Resp: Breathing unlabored on room air;  no wheezing. Psych: Fluid speech in conversation; appropriate affect;  normal thought process  *[facility]/Ortho Exam: Physical Exam   *Legs/Knees: No redness swelling or effusion of the knees.  There is patellofemoral crepitus noted.  There is lower extremity deformity and gait abnormality from her underlying cerebral palsy.  No specific lower extremity pitting edema today from the leg down to the foot.  Good cap refill bilaterally.   Imaging:  09/14/24: 4 views of bilateral knees including standing AP, Rosenberg, lateral and  sunrise views were ordered and reviewed by myself today.  X-rays  demonstrate mild right knee medial tibiofemoral arthritic change, the left  knee is relatively well-preserved in the weightbearing surfaces.  There is  mild to moderate patellofemoral arthralgia on the left and at least  moderate patellofemoral arthritic change on the right knee with notable  osteophytosis.  No acute fracture or otherwise acute bony abnormality  noted.   Past Medical/Family/Surgical/Social History: Medications & Allergies reviewed per EMR, new medications updated. 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   Bilateral ankle pain 07/30/2014   Acquired pes planus of right foot 06/10/2014   Acquired deformity of ankle and foot 10/01/2013   Other acquired deformities of unspecified foot 10/01/2013   Hallux valgus with bunions 07/27/2013   Other specified dermatoses 07/27/2013   Pes planus 07/27/2013   Primary osteoarthritis, unspecified ankle and foot 07/27/2013   Sebaceous cyst 07/27/2013   Skin disorder 07/27/2013   Vulvar discomfort 07/27/2013   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   Paresthesias  07/06/2012   Skin sensation disturbance 07/06/2012   Past Medical History:  Diagnosis Date   Arthritis    CP (cerebral palsy) (HCC)    Family History  Problem Relation Age of Onset   Hypertension Mother    Past Surgical History:  Procedure Laterality Date   ANKLE FUSION     LEG SURGERY     TRACHEOSTOMY     Social History   Occupational History   Not on file  Tobacco Use   Smoking status: Never   Smokeless tobacco: Never  Substance and Sexual Activity   Alcohol use: No   Drug use: Yes    Types: Marijuana   Sexual activity: Yes    Birth control/protection: None   I spent 43 minutes in the care of the patient today including face-to-face time, preparation to see the patient, as well as review of previous imaging, discussion regarding multifactorial pathology and steps for the above, written instructions regarding my treatment plan as well as recommended follow-up with PCP for lower leg swelling, prescription written for rolling walker and other DME for the above diagnoses.   Lonell Sprang, DO Primary Care Sports Medicine Physician  [facility]  This note was dictated using Dragon naturally speaking software and may contain errors in syntax, spelling, or content which have not been identified prior to signing this note.    "

## 2025-01-08 NOTE — Progress Notes (Signed)
 Patient says that she did not get any relief from the knee injections at her last visit. She says that they are painful and making it difficult to walk. She has also had swelling in the ankles that lasted for about 4 days. She says that they are no longer swollen, but she would like to discuss. She is having more difficulty walking, and is therefore unable to work.

## 2025-01-21 ENCOUNTER — Ambulatory Visit: Admitting: Sports Medicine

## 2025-01-22 ENCOUNTER — Other Ambulatory Visit: Payer: Self-pay

## 2025-01-22 ENCOUNTER — Ambulatory Visit: Admitting: Sports Medicine

## 2025-01-22 ENCOUNTER — Encounter: Payer: Self-pay | Admitting: Sports Medicine

## 2025-01-22 DIAGNOSIS — M170 Bilateral primary osteoarthritis of knee: Secondary | ICD-10-CM | POA: Diagnosis not present

## 2025-01-22 DIAGNOSIS — R269 Unspecified abnormalities of gait and mobility: Secondary | ICD-10-CM

## 2025-01-22 DIAGNOSIS — G809 Cerebral palsy, unspecified: Secondary | ICD-10-CM

## 2025-01-22 MED ORDER — BUPIVACAINE HCL 0.25 % IJ SOLN
2.0000 mL | INTRAMUSCULAR | Status: AC | PRN
  Administered 2025-01-22: 2 mL via INTRA_ARTICULAR

## 2025-01-22 MED ORDER — LIDOCAINE HCL 1 % IJ SOLN
2.0000 mL | INTRAMUSCULAR | Status: AC | PRN
  Administered 2025-01-22: 2 mL

## 2025-01-22 MED ORDER — METHYLPREDNISOLONE ACETATE 40 MG/ML IJ SUSP
60.0000 mg | INTRAMUSCULAR | Status: AC | PRN
  Administered 2025-01-22: 60 mg via INTRA_ARTICULAR

## 2025-01-22 NOTE — Progress Notes (Signed)
 "  Amber Juarez - 49 y.o. female MRN [id number]  Date of birth: 04-20-1976  Office Visit Note: Visit Date: 01/22/2025 PCP: Hughie Sharper, MD Referred by: Hughie Sharper, MD  Subjective: Chief Complaint  Patient presents with   Right Leg - Pain   Left Leg - Pain   HPI: Amber Juarez is a pleasant 49 y.o. female who presents today for follow-up of bilateral knee pain in setting of cerebral palsy.  Does have bilateral knee pain with more so patellofemoral joint osteoarthritis.  Her right knee is worse than left knee.  Referral has been sent for formalized physical therapy at [facility], though currently is pending and has not set up initial evaluation.  She has received good relief from [place]/viscosupplementation injections in the past, although here more recently this was not as helpful.   She did see her PCP for her leg swelling, note reviewed from 01/15/2025.  Was believed to be venous insufficiency.  Has previously had a normal echo and normal proBNP in the past.  Recommended compression stockings regularly as well as ambulatory referral to medical nutrition.   Lab Results  Component Value Date   HGBA1C 5.7 (H) 07/31/2020   Pertinent ROS were reviewed with the patient and found to be negative unless otherwise specified above in HPI.   Assessment & Plan: Visit Diagnoses:  1. Primary osteoarthritis of both knees   2. Cerebral palsy, unspecified type (HCC)   3. Gait difficulty    Plan: Impression is acute on chronic bilateral knee pain with patellofemoral arthralgia.  Did discuss with Amber Juarez this is likely exacerbated with a mild right knee effusion in the setting of her congenital partial quadriplegia with CP.  Through shared decision making, did proceed with both right and left knee ultrasound-guided intra-articular injection with corticosteroid, patient tolerated well.  Advised on postinjection protocol.  I would like her to now get started in formalized  physical therapy and neurorehab given that her knee relief should improve with today's injections.  She will notify me in 1 week if she has not been scheduled for physical therapy, and we can reach back out to that referral process.  May use ice/heat and/or Tylenol  for any postinjection pain.  Avoid NSAID use consistently given her pedal edema, she does have Lasix  to use as needed per her PCP.  She did discuss at the end of the visit being evaluated for her back pain, she may make a separate appointment for this at her leisure.  Follow-up: Return for Schedule new appt for back pain.   Meds & Orders: No orders of the defined types were placed in this encounter.   Orders Placed This Encounter  Procedures   Large Joint Inj   Large Joint Inj   US  Guided Needle Placement - No Linked Charges     Procedures: Large Joint Inj: R knee on 01/22/2025 1:14 PM Indications: pain Details: 22 G 1.5 in needle, ultrasound-guided superolateral approach Medications: 2 mL lidocaine  1 %; 2 mL bupivacaine  0.25 %; 60 mg methylPREDNISolone  acetate 40 MG/ML Outcome: tolerated well, no immediate complications  Procedure: Ultrasound-guided Knee Injection, Right After discussion on risk/benefits/indication, informed verbal consent was obtained. A timeout was then performed. The patient was lying in supine on examination table with a small bolster underneath the affected knee for comfort. The patient's knee was prepped with Betadine and alcohol swabs. Utilizing ultrasound-guidance with the probe in a transverse position, the patient's suprapatellar bursa was identified and the knee joint was  subsequently injected intraarticularly with a mixture of 2:2:1.5 lidocaine :depomedrol utilizing an in-plane visualization approach. Patient tolerated the procedure well without immediate complications.  Procedure, treatment alternatives, risks and benefits explained, specific risks discussed. Consent was given by the patient. Patient  was prepped and draped in the usual sterile fashion.    Large Joint Inj: L knee on 01/22/2025 1:14 PM Indications: pain Details: 22 G 1.5 in needle, ultrasound-guided superolateral approach Medications: 2 mL lidocaine  1 %; 2 mL bupivacaine  0.25 %; 60 mg methylPREDNISolone  acetate 40 MG/ML Outcome: tolerated well, no immediate complications  Procedure: Ultrasound-guided Knee Injection, Left After discussion on risk/benefits/indication, informed verbal consent was obtained. A timeout was then performed. The patient was lying in supine on examination table with a small bolster underneath the affected knee for comfort. The patient's knee was prepped with Betadine and alcohol swabs. Utilizing ultrasound-guidance with the probe in a transverse position, the patient's suprapatellar bursa was identified and the knee joint was subsequently injected intraarticularly with a mixture of 2:2:1.5 lidocaine :depomedrol utilizing an in-plane visualization approach. Patient tolerated the procedure well without immediate complications. Procedure, treatment alternatives, risks and benefits explained, specific risks discussed. Consent was given by the patient. Patient was prepped and draped in the usual sterile fashion.          Clinical History: No specialty comments available.  She reports that she has never smoked. She has never used smokeless tobacco. No results for input(s): HGBA1C, LABURIC in the last 8760 hours.  Objective:    Physical Exam  Gen: Well-appearing, in no acute distress; non-toxic CV: Well-perfused. Warm.  Resp: Breathing unlabored on room air; no wheezing. Psych: Fluid speech in conversation; appropriate affect; normal thought process  Ortho Exam - Bilateral knees: Right knee with a small effusion, left knee without significant effusion. Patient does have evidence of underlying quadriplastic cerebral palsy with gait, knees fall valgus to a degree.   Imaging:  09/14/24: 4 views of  bilateral knees including standing AP, Rosenberg, lateral and sunrise views were ordered and reviewed by myself today. X-rays demonstrate mild right knee medial tibiofemoral arthritic change, the left knee is relatively well-preserved in the weightbearing surfaces. There is mild to moderate patellofemoral arthralgia on the left and at least moderate patellofemoral arthritic change on the right knee with notable osteophytosis. No acute fracture or otherwise acute bony abnormality noted.   Past Medical/Family/Surgical/Social History: Medications & Allergies reviewed per EMR, new medications updated. 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   Bilateral ankle pain 07/30/2014   Acquired pes planus of right foot 06/10/2014   Acquired deformity of ankle and foot 10/01/2013   Other acquired deformities of unspecified foot 10/01/2013   Hallux valgus with bunions 07/27/2013   Other specified dermatoses 07/27/2013   Pes planus 07/27/2013   Primary osteoarthritis, unspecified ankle and foot 07/27/2013   Sebaceous cyst 07/27/2013   Skin disorder 07/27/2013   Vulvar discomfort 07/27/2013   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   Paresthesias 07/06/2012   Skin sensation disturbance 07/06/2012   Past Medical History:  Diagnosis Date   Arthritis    CP (cerebral palsy) (HCC)    Family History  Problem Relation Age of Onset  Hypertension Mother    Past Surgical History:  Procedure Laterality Date   ANKLE FUSION     LEG SURGERY     TRACHEOSTOMY     Social History   Occupational History   Not on file  Tobacco Use   Smoking status: Never    Smokeless tobacco: Never  Substance and Sexual Activity   Alcohol use: No   Drug use: Yes    Types: Marijuana   Sexual activity: Yes    Birth control/protection: None   "

## 2025-02-01 ENCOUNTER — Encounter: Admitting: Sports Medicine

## 2025-02-11 ENCOUNTER — Ambulatory Visit: Admitting: Physical Therapy

## 2025-02-14 ENCOUNTER — Encounter: Admitting: Sports Medicine

## 2025-04-19 ENCOUNTER — Ambulatory Visit: Admitting: Sports Medicine
